# Patient Record
Sex: Female | Born: 1935 | Hispanic: No | Smoking: Never smoker
Health system: Southern US, Community
[De-identification: ages and names within clinical notes are randomized; demographics above are authoritative.]

## PROBLEM LIST (undated history)

## (undated) DIAGNOSIS — M47812 Spondylosis without myelopathy or radiculopathy, cervical region: Secondary | ICD-10-CM

## (undated) DIAGNOSIS — G450 Vertebro-basilar artery syndrome: Secondary | ICD-10-CM

## (undated) DIAGNOSIS — T148XXA Other injury of unspecified body region, initial encounter: Secondary | ICD-10-CM

## (undated) DIAGNOSIS — S92321A Displaced fracture of second metatarsal bone, right foot, initial encounter for closed fracture: Secondary | ICD-10-CM

## (undated) DIAGNOSIS — G5600 Carpal tunnel syndrome, unspecified upper limb: Secondary | ICD-10-CM

## (undated) DIAGNOSIS — I6529 Occlusion and stenosis of unspecified carotid artery: Secondary | ICD-10-CM

## (undated) DIAGNOSIS — K219 Gastro-esophageal reflux disease without esophagitis: Secondary | ICD-10-CM

## (undated) DIAGNOSIS — E785 Hyperlipidemia, unspecified: Secondary | ICD-10-CM

## (undated) DIAGNOSIS — I4891 Unspecified atrial fibrillation: Secondary | ICD-10-CM

## (undated) DIAGNOSIS — G603 Idiopathic progressive neuropathy: Secondary | ICD-10-CM

## (undated) DIAGNOSIS — I639 Cerebral infarction, unspecified: Secondary | ICD-10-CM

## (undated) DIAGNOSIS — M353 Polymyalgia rheumatica: Secondary | ICD-10-CM

## (undated) DIAGNOSIS — E039 Hypothyroidism, unspecified: Secondary | ICD-10-CM

## (undated) DIAGNOSIS — R55 Syncope and collapse: Secondary | ICD-10-CM

## (undated) DIAGNOSIS — M47817 Spondylosis without myelopathy or radiculopathy, lumbosacral region: Secondary | ICD-10-CM

## (undated) HISTORY — DX: Polymyalgia rheumatica: M35.3

## (undated) HISTORY — DX: Occlusion and stenosis of unspecified carotid artery: I65.29

## (undated) HISTORY — PX: CARPAL TUNNEL RELEASE: SHX101

## (undated) HISTORY — DX: Vertebro-basilar artery syndrome: G45.0

## (undated) HISTORY — DX: Unspecified atrial fibrillation: I48.91

## (undated) HISTORY — PX: SUBCLAVIAN STENT PLACEMENT: SUR1038

## (undated) HISTORY — DX: Gastro-esophageal reflux disease without esophagitis: K21.9

## (undated) HISTORY — DX: Hyperlipidemia, unspecified: E78.5

## (undated) HISTORY — DX: Carpal tunnel syndrome, unspecified upper limb: G56.00

## (undated) HISTORY — DX: Other injury of unspecified body region, initial encounter: T14.8XXA

## (undated) HISTORY — DX: Idiopathic progressive neuropathy: G60.3

## (undated) HISTORY — DX: Spondylosis without myelopathy or radiculopathy, lumbosacral region: M47.817

## (undated) HISTORY — DX: Hypothyroidism, unspecified: E03.9

## (undated) HISTORY — DX: Cerebral infarction, unspecified: I63.9

## (undated) HISTORY — DX: Spondylosis without myelopathy or radiculopathy, cervical region: M47.812

## (undated) HISTORY — DX: Displaced fracture of second metatarsal bone, right foot, initial encounter for closed fracture: S92.321A

## (undated) HISTORY — DX: Syncope and collapse: R55

---

## 1998-06-23 ENCOUNTER — Other Ambulatory Visit: Admission: RE | Admit: 1998-06-23 | Discharge: 1998-06-23 | Payer: Self-pay | Admitting: Obstetrics and Gynecology

## 1999-07-22 ENCOUNTER — Other Ambulatory Visit: Admission: RE | Admit: 1999-07-22 | Discharge: 1999-07-22 | Payer: Self-pay | Admitting: Obstetrics and Gynecology

## 2000-08-02 ENCOUNTER — Other Ambulatory Visit: Admission: RE | Admit: 2000-08-02 | Discharge: 2000-08-02 | Payer: Self-pay | Admitting: Obstetrics and Gynecology

## 2001-10-19 ENCOUNTER — Other Ambulatory Visit: Admission: RE | Admit: 2001-10-19 | Discharge: 2001-10-19 | Payer: Self-pay | Admitting: Obstetrics and Gynecology

## 2014-10-07 DIAGNOSIS — B9620 Unspecified Escherichia coli [E. coli] as the cause of diseases classified elsewhere: Secondary | ICD-10-CM | POA: Insufficient documentation

## 2014-10-07 DIAGNOSIS — N390 Urinary tract infection, site not specified: Secondary | ICD-10-CM

## 2014-10-07 DIAGNOSIS — I4819 Other persistent atrial fibrillation: Secondary | ICD-10-CM | POA: Insufficient documentation

## 2015-10-15 DIAGNOSIS — I739 Peripheral vascular disease, unspecified: Secondary | ICD-10-CM | POA: Insufficient documentation

## 2016-03-05 LAB — HEPATIC FUNCTION PANEL
ALK PHOS: 62 U/L (ref 25–125)
ALT: 9 U/L (ref 7–35)
AST: 23 U/L (ref 13–35)
BILIRUBIN, TOTAL: 0.4 mg/dL

## 2016-03-08 LAB — CBC AND DIFFERENTIAL
HCT: 32 % — AB (ref 36–46)
HEMOGLOBIN: 10.3 g/dL — AB (ref 12.0–16.0)
PLATELETS: 236 10*3/uL (ref 150–399)
WBC: 8.3 10*3/mL

## 2016-03-08 LAB — BASIC METABOLIC PANEL
BUN: 20 mg/dL (ref 4–21)
Creatinine: 1.4 mg/dL — AB (ref 0.5–1.1)
Potassium: 3.8 mmol/L (ref 3.4–5.3)
SODIUM: 142 mmol/L (ref 137–147)

## 2016-03-09 ENCOUNTER — Encounter: Payer: Self-pay | Admitting: Internal Medicine

## 2016-03-09 ENCOUNTER — Non-Acute Institutional Stay (SKILLED_NURSING_FACILITY): Payer: Medicare HMO | Admitting: Internal Medicine

## 2016-03-09 DIAGNOSIS — E785 Hyperlipidemia, unspecified: Secondary | ICD-10-CM

## 2016-03-09 DIAGNOSIS — S92401S Displaced unspecified fracture of right great toe, sequela: Secondary | ICD-10-CM

## 2016-03-09 DIAGNOSIS — R5381 Other malaise: Secondary | ICD-10-CM | POA: Diagnosis not present

## 2016-03-09 DIAGNOSIS — D638 Anemia in other chronic diseases classified elsewhere: Secondary | ICD-10-CM

## 2016-03-09 DIAGNOSIS — I480 Paroxysmal atrial fibrillation: Secondary | ICD-10-CM | POA: Diagnosis not present

## 2016-03-09 DIAGNOSIS — N183 Chronic kidney disease, stage 3 unspecified: Secondary | ICD-10-CM

## 2016-03-09 DIAGNOSIS — Z8673 Personal history of transient ischemic attack (TIA), and cerebral infarction without residual deficits: Secondary | ICD-10-CM | POA: Diagnosis not present

## 2016-03-09 DIAGNOSIS — M792 Neuralgia and neuritis, unspecified: Secondary | ICD-10-CM | POA: Diagnosis not present

## 2016-03-09 DIAGNOSIS — E039 Hypothyroidism, unspecified: Secondary | ICD-10-CM | POA: Diagnosis not present

## 2016-03-09 DIAGNOSIS — N390 Urinary tract infection, site not specified: Secondary | ICD-10-CM

## 2016-03-09 DIAGNOSIS — K219 Gastro-esophageal reflux disease without esophagitis: Secondary | ICD-10-CM

## 2016-03-09 DIAGNOSIS — Z9189 Other specified personal risk factors, not elsewhere classified: Secondary | ICD-10-CM | POA: Diagnosis not present

## 2016-03-09 DIAGNOSIS — Z9181 History of falling: Secondary | ICD-10-CM

## 2016-03-09 NOTE — Progress Notes (Signed)
LOCATION: [facility]  PCP: No primary care provider on file.   Code Status: DNR  Goals of care: Advanced Directive information Advanced Directives 03/09/2016  Does patient have an advance directive? Yes  Type of Advance Directive Out of facility DNR (pink MOST or yellow form)  Does patient want to make changes to advanced directive? No - Patient declined  Copy of advanced directive(s) in chart? Yes       No emergency contact information on file.   Allergies  Allergen Reactions  . Bactrim [Sulfamethoxazole-Trimethoprim]   . Macrobid [employer] Macro]   . Penicillins     Chief Complaint  Patient presents with  . New Admit To SNF    New Admission Visit     HPI:  Patient is a 82 y.o. female seen today for short term rehabilitation post hospital admission from 03/04/16-03/08/16 post fall and syncope with right second and fifth toe proximal phalanx fracture. Her syncope workup was unremarkable. She was seen by orthopedic and had a right foot darby shoe placed. She was treated for UTI and has hx of recurrent UTI. She has PMH of frequent falls, recurrent cystitis, afib, CVA, ckd stage 3 among others. She lives at home with her daughter.   Review of Systems:  Constitutional: Negative for fever, chills, diaphoresis.  HENT: Negative for headache, congestion, nasal discharge Eyes: Negative for blurred vision, double vision and discharge.  Respiratory: Negative for cough, shortness of breath and wheezing.   Cardiovascular: Negative for chest pain, palpitations, leg swelling.  Gastrointestinal: Negative for heartburn, nausea, vomiting, abdominal pain. Last bowel movement was this morning. Genitourinary: Negative for dysuria and flank pain.  Musculoskeletal: Negative for back pain, fall in the facility.  Skin: Negative for itching, rash.  Neurological: Negative for dizziness. Psychiatric/Behavioral: Negative for depression   Past Medical History:  Diagnosis  Date  . Atrial fibrillation (HCC)   . Closed fracture of second metatarsal bone of right foot   . GERD (gastroesophageal reflux disease)   . Hypothyroidism   . Stroke (HCC)   . Syncope    History reviewed. No pertinent surgical history. Social History:   has no tobacco, alcohol, and drug history on file.  History reviewed. No pertinent family history.  Medications:   Medication List       Accurate as of 03/09/16 12:49 PM. Always use your most recent med list.          atorvastatin 40 MG tablet Commonly known as:  LIPITOR Take 40 mg by mouth at bedtime.   CALTRATE 600+D3 SOFT PO Take 1 tablet by mouth daily.   DULoxetine 60 MG capsule Commonly known as:  CYMBALTA Take 60 mg by mouth daily.   gabapentin 300 MG capsule Commonly known as:  NEURONTIN Take 300 mg by mouth at bedtime.   levothyroxine 50 MCG tablet Commonly known as:  SYNTHROID, LEVOTHROID Take 50 mcg by mouth daily before breakfast.   metoprolol succinate 25 MG 24 hr tablet Commonly known as:  TOPROL-XL Take 12.5 mg by mouth daily.   multivitamin tablet Take 1 tablet by mouth daily.   omeprazole 40 MG capsule Commonly known as:  PRILOSEC Take 40 mg by mouth daily.   Rivaroxaban 15 MG Tabs tablet Commonly known as:  XARELTO Take 15 mg by mouth daily with supper.   traMADol 50 MG tablet Commonly known as:  ULTRAM Take 50 mg by mouth 2 (two) times daily as needed.   travoprost (benzalkonium) 0.004 % ophthalmic solution Commonly  known as:  TRAVATAN Place 1 drop into both eyes at bedtime.       Immunizations:  There is no immunization history on file for this patient.   Physical Exam:  Vitals:   03/09/16 1233  BP: (!) 155/97  Resp: 18  Temp: 97.6 F (36.4 C)  TempSrc: Oral  SpO2: 95%  Weight: 140 lb (63.5 kg)  Height: 5\' 2"  (1.575 m)   Body mass index is 25.61 kg/m.  General- elderly female, well built, in no acute distress Head- normocephalic, atraumatic Nose- no nasal  discharge Throat- moist mucus membrane Eyes- PERRLA, EOMI, no pallor, no icterus, no discharge, normal conjunctiva, normal sclera Neck- no cervical lymphadenopathy Cardiovascular- normal s1,s2, no murmur, no leg edema Respiratory- bilateral clear to auscultation, no wheeze, no rhonchi, no crackles, no use of accessory muscles Abdomen- bowel sounds present, soft, non tender Musculoskeletal- able to move all 4 extremities, generalized weakness Neurological- alert and oriented to person, place and time, slow with answers, has some cognitive deficit Skin- warm and dry, right leg dressing in place and left leg skin tear noted, chronic skin changes to lower legs Psychiatry- normal mood and affect    Labs reviewed: Basic Metabolic Panel:  Recent Labs  16/10/96  NA 142  K 3.8  BUN 20  CREATININE 1.4*   Liver Function Tests:  Recent Labs  03/05/16  AST 23  ALT 9  ALKPHOS 62   No results for input(s): LIPASE, AMYLASE in the last 8760 hours. No results for input(s): AMMONIA in the last 8760 hours. CBC:  Recent Labs  03/08/16  WBC 8.3  HGB 10.3*  HCT 32*  PLT 236    Radiological Exams: Xr Forearm 03/04/16 Impression. No acute findings. No evidence of fracture or radiopaque foreign body.  Xr Foot 3 or More Views Right 03/04/16 Impression. Nondisplaced fractures involving proximal phalanges of the second through fifth toes.  Echocardiogram W Corlorflow Spectral Doppler 03/05/16 Impression. Ejection fraction is visually estimated at 60-65% Normal left ventricular systolic function. Normal right ventricular size and function. Mild mitral regurgitation. Mild tricuspid regurgitation by color flow doppler examination.  PvL Carotid Duplex Bilateral 03/05/16 Impression. There is heterogenous atherosclerosis seen in both carotid arteries, but only mild internal carotid artery stenosis, 1-39% bilaterally. 2. Vertebral flow is antegrade and normal bilaterally. 3. Subclavian flow is  multiphasic and normal bilaterally.  PvL Venous Duplex Lower Extremity Bilateral 03/05/16 Impression. There is no evidence of DVT or superficial vein thrombophlebitis in the lower extremities bilaterally.   Assessment/Plan  Physical deconditioning Will have her work with physical therapy and occupational therapy team to help with gait training and muscle strengthening exercises.fall precautions. Skin care. Encourage to be out of bed.   Right 2nd and 5th toe proximal phlanax fracture Seen by orthopedic. To wear her derby shoe and WBAT. Will have patient work with PT/OT as tolerated to regain strength and restore function.  Fall precautions are in place.f/u with orthopedic. Continue tramadol 50 mg bid prn pain. Continue calcium and vitamin d supplement  At high risk for injury related to fall High fall risk, fall precautions, on anticoagulation, will need to talk about continuation of anticoagulation if she continues to have fall  Anemia of chronic disease With ckd, monitor cbc  ckd stage 3 Monitor cbc  Recurrent uti Currently asymptomatic and completed her natibiotic. Monitor clinically. Perineal hygiene and hydration to be maintained  Hypothyroidism Continue levothyroxine 50 mcg daily  gerd Continue omeprazole  Elevated BP No known hx of  HTN. Monitor BP q shift x 1 week and if > 3 reading > 150/90, start antihypertensive  HLD Continue atorvastatin  History of CVA Continue atorvastatin and xarelto  afib Rate controlled. Continue toprol xl and xarelto  Neuropathic pain Fall risk, continue gabapentin 300 mg qhs, fall precautions    Goals of care: short term rehabilitation   Labs/tests ordered: cbc, cmp  Family/ staff Communication: reviewed care plan with patient and nursing supervisor    Oneal GroutMAHIMA Adra Shepler, MD Internal Medicine [facility] [address] [place], [place] [address] Cell Phone (Monday-Friday 8 am - 5 pm):  [phone] On Call: [phone] and follow prompts after 5 pm and on weekends Office Phone: [phone] Office Fax: [phone]

## 2016-03-15 LAB — BASIC METABOLIC PANEL
BUN: 16 mg/dL (ref 4–21)
CREATININE: 1.3 mg/dL — AB (ref 0.5–1.1)
Glucose: 113 mg/dL
Potassium: 4.3 mmol/L (ref 3.4–5.3)
Sodium: 143 mmol/L (ref 137–147)

## 2016-03-15 LAB — CBC AND DIFFERENTIAL
HCT: 34 % — AB (ref 36–46)
HEMOGLOBIN: 11 g/dL — AB (ref 12.0–16.0)
Neutrophils Absolute: 4 /uL
Platelets: 256 10*3/uL (ref 150–399)
WBC: 6.4 10*3/mL

## 2016-03-15 LAB — HEPATIC FUNCTION PANEL
ALK PHOS: 79 U/L (ref 25–125)
ALT: 15 U/L (ref 7–35)
AST: 30 U/L (ref 13–35)
Bilirubin, Total: 0.5 mg/dL

## 2016-03-22 ENCOUNTER — Encounter: Payer: Self-pay | Admitting: Adult Health

## 2016-03-22 ENCOUNTER — Non-Acute Institutional Stay (SKILLED_NURSING_FACILITY): Payer: Medicare HMO | Admitting: Adult Health

## 2016-03-22 DIAGNOSIS — R5381 Other malaise: Secondary | ICD-10-CM

## 2016-03-22 DIAGNOSIS — M792 Neuralgia and neuritis, unspecified: Secondary | ICD-10-CM

## 2016-03-22 DIAGNOSIS — I10 Essential (primary) hypertension: Secondary | ICD-10-CM

## 2016-03-22 DIAGNOSIS — E039 Hypothyroidism, unspecified: Secondary | ICD-10-CM

## 2016-03-22 DIAGNOSIS — H409 Unspecified glaucoma: Secondary | ICD-10-CM | POA: Diagnosis not present

## 2016-03-22 DIAGNOSIS — I480 Paroxysmal atrial fibrillation: Secondary | ICD-10-CM | POA: Diagnosis not present

## 2016-03-22 DIAGNOSIS — E785 Hyperlipidemia, unspecified: Secondary | ICD-10-CM

## 2016-03-22 DIAGNOSIS — S92401S Displaced unspecified fracture of right great toe, sequela: Secondary | ICD-10-CM

## 2016-03-22 DIAGNOSIS — F329 Major depressive disorder, single episode, unspecified: Secondary | ICD-10-CM

## 2016-03-22 DIAGNOSIS — Z8673 Personal history of transient ischemic attack (TIA), and cerebral infarction without residual deficits: Secondary | ICD-10-CM | POA: Diagnosis not present

## 2016-03-22 DIAGNOSIS — N183 Chronic kidney disease, stage 3 unspecified: Secondary | ICD-10-CM

## 2016-03-22 DIAGNOSIS — D638 Anemia in other chronic diseases classified elsewhere: Secondary | ICD-10-CM | POA: Diagnosis not present

## 2016-03-22 DIAGNOSIS — F32A Depression, unspecified: Secondary | ICD-10-CM

## 2016-03-22 DIAGNOSIS — K219 Gastro-esophageal reflux disease without esophagitis: Secondary | ICD-10-CM

## 2016-03-22 NOTE — Progress Notes (Signed)
Patient ID: Erin Fisher, female   DOB: Mar 14, 1929, 82 y.o.   MRN: [id number]    DATE:  03/22/2016   MRN:  [id number]  BIRTHDAY: Mar 14, 1929  Facility:  Nursing Home Location:  [facility]  Nursing Home Room Number: 1002-A  LEVEL OF CARE:  SNF (31)  Contact Information    Name Relation Home Work Mobile   White,Sharon Daughter [phone]     No name specified           Code Status History    This patient does not have a recorded code status. Please follow your organizational policy for patients in this situation.    Advance Directive Documentation   Flowsheet Row Most Recent Value  Type of Advance Directive  Out of facility DNR (pink MOST or yellow form)  Pre-existing out of facility DNR order (yellow form or pink MOST form)  No data  "MOST" Form in Place?  No data       Chief Complaint  Patient presents with  . Discharge Note    HISTORY OF PRESENT ILLNESS:  This is an 82 year old female who is for discharge home with Home health PT, OT and CNA.  [facility] 03/04/16 - 03/08/16. She was hospitalized post fall and syncope with right second and fifth toe proximal phalanx fracture. Her wok-up in the hospital was unremarkable. Orthopedic was consulted and was placed on right foot darby shoe. She was treated for UTI.  She was seen today in her room while combing herself in front of the mirror. She was happy to be going home, smiling.  Patient was admitted to this facility for short-term rehabilitation after the patient's recent hospitalization.  Patient has completed SNF rehabilitation and therapy has cleared the patient for discharge.     PAST MEDICAL HISTORY:  Past Medical History:  Diagnosis Date  . Atrial fibrillation (HCC)   . Closed fracture of second metatarsal bone of right foot   . GERD (gastroesophageal reflux disease)   . Hypothyroidism   . Stroke  (HCC)   . Syncope      CURRENT MEDICATIONS: Reviewed  Patient's Medications  New Prescriptions   No medications on file  Previous Medications   ACETAMINOPHEN (TYLENOL) 325 MG TABLET    Take 650 mg by mouth every 4 (four) hours as needed for mild pain or moderate pain.   ATORVASTATIN (LIPITOR) 40 MG TABLET    Take 40 mg by mouth at bedtime.   CALCIUM CARB-CHOLECALCIFEROL (CALTRATE 600+D3 SOFT PO)    Take 1 tablet by mouth daily.   DULOXETINE (CYMBALTA) 60 MG CAPSULE    Take 60 mg by mouth daily.    GABAPENTIN (NEURONTIN) 300 MG CAPSULE    Take 300 mg by mouth at bedtime.    LATANOPROST (XALATAN) 0.005 % OPHTHALMIC SOLUTION    Place 1 drop into both eyes at bedtime.   LEVOTHYROXINE (SYNTHROID, LEVOTHROID) 50 MCG TABLET    Take 50 mcg by mouth daily before breakfast.    METOPROLOL SUCCINATE (TOPROL-XL) 25 MG 24 HR TABLET    Take 12.5 mg by mouth daily.    MULTIPLE VITAMIN (MULTIVITAMIN) TABLET    Take 1 tablet by mouth daily.   OMEPRAZOLE (PRILOSEC) 40 MG CAPSULE    Take 40 mg by mouth daily.    RIVAROXABAN (XARELTO) 15 MG TABS TABLET    Take 15 mg by mouth daily with supper.   Modified  Medications   No medications on file  Discontinued Medications   TRAMADOL (ULTRAM) 50 MG TABLET    Take 50 mg by mouth 2 (two) times daily as needed.   TRAVOPROST, BENZALKONIUM, (TRAVATAN) 0.004 % OPHTHALMIC SOLUTION    Place 1 drop into both eyes at bedtime.     Allergies  Allergen Reactions  . Bactrim [Sulfamethoxazole-Trimethoprim]   . Macrobid [employer][Nitrofurantoin [employer] Macro]   . Penicillins      REVIEW OF SYSTEMS:  GENERAL: no change in appetite, no fatigue, no weight changes, no fever, chills or weakness EYES: Denies change in vision, dry eyes, eye pain, itching or discharge EARS: Denies change in hearing, ringing in ears, or earache NOSE: Denies nasal congestion or epistaxis MOUTH and THROAT: Denies oral discomfort, gingival pain or bleeding, pain from teeth or hoarseness   RESPIRATORY:  no cough, SOB, DOE, wheezing, hemoptysis CARDIAC: no chest pain, edema or palpitations GI: no abdominal pain, diarrhea, constipation, heart burn, nausea or vomiting GU: Denies dysuria, frequency, hematuria, incontinence, or discharge PSYCHIATRIC: Denies feeling of depression or anxiety. No report of hallucinations, insomnia, paranoia, or agitation    PHYSICAL EXAMINATION  GENERAL APPEARANCE: Well nourished. In no acute distress. Normal body habitus SKIN:  Right posterior forearm with foam dressing and right shin with silicone dressing HEAD: Normal in size and contour. No evidence of trauma EYES: Lids open and close normally. No blepharitis, entropion or ectropion. PERRL. Conjunctivae are clear and sclerae are white. Lenses are without opacity EARS: Pinnae are normal. Patient hears normal voice tunes of the examiner MOUTH and THROAT: Lips are without lesions. Oral mucosa is moist and without lesions. Tongue is normal in shape, size, and color and without lesions NECK: supple, trachea midline, no neck masses, no thyroid tenderness, no thyromegaly LYMPHATICS: no LAN in the neck, no supraclavicular LAN RESPIRATORY: breathing is even & unlabored, BS CTAB CARDIAC: RRR, no murmur,no extra heart sounds, Right foot 1+ edema GI: abdomen soft, normal BS, no masses, no tenderness, no hepatomegaly, no splenomegaly EXTREMITIES:  Able to move X 4 extremities; Right foot with darby boot PSYCHIATRIC: Alert and oriented X 3. Affect and behavior are appropriate  LABS/RADIOLOGY: Labs reviewed: Basic Metabolic Panel:  Recent Labs  30/86/5710/16/17 03/15/16  NA 142 143  K 3.8 4.3  BUN 20 16  CREATININE 1.4* 1.3*   Liver Function Tests:  Recent Labs  03/05/16 03/15/16  AST 23 30  ALT 9 15  ALKPHOS 62 79   CBC:  Recent Labs  03/08/16 03/15/16  WBC 8.3 6.4  NEUTROABS  --  4  HGB 10.3* 11.0*  HCT 32* 34*  PLT 236 256      ASSESSMENT/PLAN:  Physical deconditioning - for Home health PT, OT and  CNA, for therapeutic strengthening exercises; fall precaution  Right 2nd and 5th toe proximal phalanx fracture -  Orthopedic was consulted in the hospital and was placed on draby shoe; WBAT; for Home health PT, OT and CNA, for therapeutic strengthening exercises; continue Tylenol 325 2 tabs= 650 mg PO Q 4 hours PRN; follow-up with orthopedics   Hypothyroidism - continue Synthroid 50 mcg 1 tab PO Q D  GERD - continue Omeprazole 40 mg 1 capsule PO Q D  Depression - continue Cymbalta 60 mg 1 capsule PO Q D  Hypertension - continue Toprol XL 25 mg 1/2 tab = 12.5 mg PO Q D  Hyperlipidemia - continue Lipitor 40 mg 1 tab PO Q HS  Neuropathic pain - continue Gabapentin 300 mg  1 capsule PO Q HS  Hx of CVA - stable; continue Xarelto 15 mg 1 tab PO Q 5PM  Glaucoma - no complaints of eye pain nor vision changes; continue Latanoprost 0.005% 1 gtt into each eye Q evening  Anemia of chronic disease  - stable Lab Results  Component Value Date   HGB 11.0 (A) 03/15/2016   CKD, stage 3 - stable Lab Results  Component Value Date   CREATININE 1.3 (A) 03/15/2016   PAF - rate-controlled; continue Toprol XL 25 mg 1/2 tab po Q D      I have filled out patient's discharge paperwork and written prescriptions.  Patient will receive home health PT, OT and CNA.  DME provided:  None  Total discharge time: Greater than 30 minutes Greater than 50% was spent in counseling and coordination of care with the patient.   Discharge time involved coordination of the discharge process with social worker, nursing staff and therapy department. Medical justification for home health services verified.     Kenard Gower, NP [employer] [phone]

## 2016-03-24 DIAGNOSIS — S92351D Displaced fracture of fifth metatarsal bone, right foot, subsequent encounter for fracture with routine healing: Secondary | ICD-10-CM | POA: Diagnosis not present

## 2016-03-24 DIAGNOSIS — S93321D Subluxation of tarsometatarsal joint of right foot, subsequent encounter: Secondary | ICD-10-CM | POA: Diagnosis not present

## 2016-03-24 DIAGNOSIS — S92341D Displaced fracture of fourth metatarsal bone, right foot, subsequent encounter for fracture with routine healing: Secondary | ICD-10-CM | POA: Diagnosis not present

## 2016-03-24 DIAGNOSIS — N183 Chronic kidney disease, stage 3 (moderate): Secondary | ICD-10-CM | POA: Diagnosis not present

## 2016-03-25 ENCOUNTER — Other Ambulatory Visit: Payer: Self-pay | Admitting: Adult Health

## 2016-06-03 DIAGNOSIS — N390 Urinary tract infection, site not specified: Secondary | ICD-10-CM | POA: Insufficient documentation

## 2016-09-28 DIAGNOSIS — S72142A Displaced intertrochanteric fracture of left femur, initial encounter for closed fracture: Secondary | ICD-10-CM | POA: Insufficient documentation

## 2016-09-28 LAB — HEPATIC FUNCTION PANEL
ALK PHOS: 0.5 U/L — AB (ref 25–125)
ALT: 16 U/L (ref 7–35)
AST: 35 U/L (ref 13–35)
Bilirubin, Total: 0.5 mg/dL

## 2016-09-29 LAB — PROTIME-INR: PROTIME: 15 s — AB (ref 10.0–13.8)

## 2016-09-29 LAB — POCT INR: INR: 1.2 — AB (ref 0.9–1.1)

## 2016-10-03 LAB — BASIC METABOLIC PANEL
BUN: 11 mg/dL (ref 4–21)
CREATININE: 1 mg/dL (ref 0.5–1.1)
Potassium: 3.6 mmol/L (ref 3.4–5.3)
Sodium: 133 mmol/L — AB (ref 137–147)

## 2016-10-06 LAB — CBC AND DIFFERENTIAL
HEMATOCRIT: 33 % — AB (ref 36–46)
Hemoglobin: 11.3 g/dL — AB (ref 12.0–16.0)
Platelets: 245 10*3/uL (ref 150–399)
WBC: 10.3 10^3/mL

## 2016-10-07 ENCOUNTER — Encounter: Payer: Self-pay | Admitting: Internal Medicine

## 2016-10-07 ENCOUNTER — Non-Acute Institutional Stay (SKILLED_NURSING_FACILITY): Payer: Medicare HMO | Admitting: Internal Medicine

## 2016-10-07 DIAGNOSIS — G450 Vertebro-basilar artery syndrome: Secondary | ICD-10-CM | POA: Insufficient documentation

## 2016-10-07 DIAGNOSIS — N390 Urinary tract infection, site not specified: Secondary | ICD-10-CM

## 2016-10-07 DIAGNOSIS — F329 Major depressive disorder, single episode, unspecified: Secondary | ICD-10-CM

## 2016-10-07 DIAGNOSIS — D62 Acute posthemorrhagic anemia: Secondary | ICD-10-CM | POA: Diagnosis not present

## 2016-10-07 DIAGNOSIS — I4819 Other persistent atrial fibrillation: Secondary | ICD-10-CM

## 2016-10-07 DIAGNOSIS — K219 Gastro-esophageal reflux disease without esophagitis: Secondary | ICD-10-CM

## 2016-10-07 DIAGNOSIS — S7012XD Contusion of left thigh, subsequent encounter: Secondary | ICD-10-CM | POA: Diagnosis not present

## 2016-10-07 DIAGNOSIS — G603 Idiopathic progressive neuropathy: Secondary | ICD-10-CM | POA: Insufficient documentation

## 2016-10-07 DIAGNOSIS — G5600 Carpal tunnel syndrome, unspecified upper limb: Secondary | ICD-10-CM | POA: Insufficient documentation

## 2016-10-07 DIAGNOSIS — I639 Cerebral infarction, unspecified: Secondary | ICD-10-CM | POA: Insufficient documentation

## 2016-10-07 DIAGNOSIS — M47812 Spondylosis without myelopathy or radiculopathy, cervical region: Secondary | ICD-10-CM | POA: Insufficient documentation

## 2016-10-07 DIAGNOSIS — M47817 Spondylosis without myelopathy or radiculopathy, lumbosacral region: Secondary | ICD-10-CM | POA: Insufficient documentation

## 2016-10-07 DIAGNOSIS — Z96642 Presence of left artificial hip joint: Secondary | ICD-10-CM | POA: Diagnosis not present

## 2016-10-07 DIAGNOSIS — I481 Persistent atrial fibrillation: Secondary | ICD-10-CM

## 2016-10-07 DIAGNOSIS — B9620 Unspecified Escherichia coli [E. coli] as the cause of diseases classified elsewhere: Secondary | ICD-10-CM

## 2016-10-07 DIAGNOSIS — M353 Polymyalgia rheumatica: Secondary | ICD-10-CM | POA: Insufficient documentation

## 2016-10-07 DIAGNOSIS — F32A Depression, unspecified: Secondary | ICD-10-CM

## 2016-10-07 DIAGNOSIS — S72145D Nondisplaced intertrochanteric fracture of left femur, subsequent encounter for closed fracture with routine healing: Secondary | ICD-10-CM | POA: Diagnosis not present

## 2016-10-07 DIAGNOSIS — E785 Hyperlipidemia, unspecified: Secondary | ICD-10-CM | POA: Insufficient documentation

## 2016-10-07 NOTE — Progress Notes (Signed)
: Provider:  Randon Goldsmith. Lyn Hollingshead, MD Location:  [facility] Room Number: [phone] Place of Service:  SNF ([phone])  PCP: No primary care provider on file. No care team member to display  Extended Emergency Contact Information Primary Emergency Contact: White,Sharon Address: [address]          [place], [place] [address] D[place] of [place] Home Phone: [phone] Relation: Daughter     Allergies: Bactrim [sulfamethoxazole-trimethoprim]; Macrobid [nitrofurantoin monohyd macro]; and Penicillins  Chief Complaint  Patient presents with  . New Admit To SNF    Admit to Facility    HPI: Patient is 83 y.o. female withA. fib, recurrent UTI, hypothyroidism, GERD, who came to the ER after a fall. Patient fell in the bathroom landing on her left side about trying to move from her wheelchair to the toilet. In the ED she was found to have a left hip fracture. Patient was admitted to [facility] from May 8-16 where she underwent a left hip intramedullary nailing for a left femur intertrochanteric fracture on 09/29/2016. Hospital course was complicated by a UTI and a left thigh hematoma. An acute blood loss from hematoma and from the postoperative period required a transfusion. Patient is admitted to skilled nursing facility with generalized weakness for OT/PT. While at skilled nursing facility patient will be followed for  depression treated with Cymbalta, and hypothyroidism treated with Synthroid annd atrial fibrillation treated with Xarelto and metoprolol.  Past Medical History:  Diagnosis Date  . Acid reflux   . Atrial fibrillation (HCC)   . Carpal tunnel syndrome   . Cervical spondylosis without myelopathy   . Closed fracture of second metatarsal bone of right foot   . GERD (gastroesophageal reflux disease)   . Hyperlipidemia   . Hypothyroidism   . Idiopathic progressive polyneuropathy   . Lumbosacral spondylosis without myelopathy   . Nerve damage     both legs and feet  . Occlusion and stenosis of unspecified carotid artery    without mention of cerebral infarction  . Polymyalgia rheumatica (HCC)   . Stroke (HCC)   . Syncope   . Vertebral artery syndrome     Past Surgical History:  Procedure Laterality Date  . CARPAL TUNNEL RELEASE    . SUBCLAVIAN STENT PLACEMENT      Allergies as of 10/07/2016      Reactions   Bactrim [sulfamethoxazole-trimethoprim]    Macrobid [nitrofurantoin Monohyd Macro]    Penicillins       Medication List       Accurate as of 10/07/16  9:44 AM. Always use your most recent med list.          atorvastatin 40 MG tablet Commonly known as:  LIPITOR Take 40 mg by mouth at bedtime.   bisacodyl 5 MG EC tablet Commonly known as:  DULCOLAX Take 10 mg by mouth daily as needed.   CALTRATE 600+D3 SOFT PO Take 1 tablet by mouth daily.   ciprofloxacin 250 MG tablet Commonly known as:  CIPRO Take 250 mg by mouth 2 (two) times daily.   DULoxetine 60 MG capsule Commonly known as:  CYMBALTA Take 60 mg by mouth daily.   gabapentin 300 MG capsule Commonly known as:  NEURONTIN Take 300 mg by mouth at bedtime.   levothyroxine 50 MCG tablet Commonly known as:  SYNTHROID, LEVOTHROID Take 50 mcg by mouth daily before breakfast.   metoprolol succinate 25 MG 24 hr tablet Commonly known as:  TOPROL-XL  Take 12.5 mg by mouth daily.   multivitamin tablet Take 1 tablet by mouth every morning.   omeprazole 40 MG capsule Commonly known as:  PRILOSEC Take 40 mg by mouth daily.   Rivaroxaban 15 MG Tabs tablet Commonly known as:  XARELTO Take 15 mg by mouth daily with supper.   traMADol 50 MG tablet Commonly known as:  ULTRAM Take 50 mg by mouth at bedtime as needed.       Meds ordered this encounter  Medications  . bisacodyl (DULCOLAX) 5 MG EC tablet    Sig: Take 10 mg by mouth daily as needed.  . ciprofloxacin (CIPRO) 250 MG tablet    Sig: Take 250 mg by mouth 2 (two) times daily.  .  traMADol (ULTRAM) 50 MG tablet    Sig: Take 50 mg by mouth at bedtime as needed.    Immunization History  Administered Date(s) Administered  . Influenza, High Dose Seasonal PF 03/17/2015  . Influenza,inj,Quad PF,36+ Mos 03/24/2014, 03/08/2016  . Pneumococcal Conjugate-13 07/10/2014  . Pneumococcal Polysaccharide-23 07/11/2014  . Tdap 03/04/2016    Social History  Substance Use Topics  . Smoking status: Never Smoker  . Smokeless tobacco: Never Used  . Alcohol use No    Family history is   Family History  Problem Relation Age of Onset  . Heart disease Mother   . Arthritis Mother   . Heart disease Father   . Cancer Daughter       Review of Systems  DATA OBTAINED: from patient, daughter GENERAL:  no fevers, fatigue, appetite changes SKIN: No itching, or rash EYES: No eye pain, redness, discharge EARS: No earache, tinnitus, change in hearing NOSE: No congestion, drainage or bleeding  MOUTH/THROAT: No mouth or tooth pain, No sore throat RESPIRATORY: No cough, wheezing, SOB CARDIAC: No chest pain, palpitations, lower extremity edema  GI: No abdominal pain, No N/V/D or constipation, No heartburn or reflux  GU: No dysuria, frequency or urgency, or incontinence  MUSCULOSKELETAL: No unrelieved bone/joint pain NEUROLOGIC: No headache, dizziness or focal weakness PSYCHIATRIC: No c/o anxiety or sadness   Vitals:   10/07/16 0917  BP: 139/81  Pulse: 71  Resp: 20  Temp: 98.1 F (36.7 C)    SpO2 Readings from Last 1 Encounters:  10/07/16 95%   Body mass index is 28.43 kg/m.     Physical Exam  GENERAL APPEARANCE: Alert, conversant,  No acute distress.  SKIN: No diaphoresis rash HEAD: Normocephalic, atraumatic  EYES: Conjunctiva/lids clear. Pupils round, reactive. EOMs intact.  EARS: External exam WNL, canals clear. Hearing grossly normal.  NOSE: No deformity or discharge.  MOUTH/THROAT: Lips w/o lesions  RESPIRATORY: Breathing is even, unlabored. Lung sounds are  clear   CARDIOVASCULAR: Heart RRR no murmurs, rubs or gallops; LLE edema >> than expected from surgery alone   GASTROINTESTINAL: Abdomen is soft, non-tender, not distended w/ normal bowel sounds. GENITOURINARY: Bladder non tender, not distended  MUSCULOSKELETAL: No abnormal joints or musculature NEUROLOGIC:  Cranial nerves 2-12 grossly intact. Moves all extremities  PSYCHIATRIC: Mood and affect appropriate to situation, no behavioral issues  Patient Active Problem List   Diagnosis Date Noted  . Acid reflux   . Carpal tunnel syndrome   . Cervical spondylosis without myelopathy   . Hyperlipidemia   . Idiopathic progressive polyneuropathy   . Lumbosacral spondylosis without myelopathy   . Polymyalgia rheumatica (HCC)   . Stroke (HCC)   . Vertebral artery syndrome   . PAF (paroxysmal atrial fibrillation) (HCC) 03/22/2016  .  History of CVA (cerebrovascular accident) 03/22/2016      Labs reviewed: Basic Metabolic Panel:    Component Value Date/Time   NA 133 (A) 10/03/2016   K 3.6 10/03/2016   BUN 11 10/03/2016   CREATININE 1.0 10/03/2016   AST 35 09/28/2016   ALT 16 09/28/2016   ALKPHOS 0.5 (A) 09/28/2016     Recent Labs  03/08/16 03/15/16 10/03/16  NA 142 143 133*  K 3.8 4.3 3.6  BUN 20 16 11   CREATININE 1.4* 1.3* 1.0   Liver Function Tests:  Recent Labs  03/05/16 03/15/16 09/28/16  AST 23 30 35  ALT 9 15 16   ALKPHOS 62 79 0.5*   No results for input(s): LIPASE, AMYLASE in the last 8760 hours. No results for input(s): AMMONIA in the last 8760 hours. CBC:  Recent Labs  03/08/16 03/15/16 10/06/16  WBC 8.3 6.4 10.3  NEUTROABS  --  4  --   HGB 10.3* 11.0* 11.3*  HCT 32* 34* 33*  PLT 236 256 245   Lipid No results for input(s): CHOL, HDL, LDLCALC, TRIG in the last 8760 hours.  Cardiac Enzymes: No results for input(s): CKTOTAL, CKMB, CKMBINDEX, TROPONINI in the last 8760 hours. BNP: No results for input(s): BNP in the last 8760 hours. No results found  for: MICROALBUR No results found for: HGBA1C No results found for: TSH No results found for: VITAMINB12 No results found for: FOLATE No results found for: IRON, TIBC, FERRITIN  Imaging and Procedures obtained prior to SNF admission: No results found.   Not all labs, radiology exams or other studies done during hospitalization come through on my EPIC note; however they are reviewed by me.    Assessment and Plan  L INTERTROCHANTERIC FEMUR FRACTURE/ S/P INTRAMEDULLARY HIP NAILING -  On 5/9, prophylaxis with xarelto SNF - admitted for OT/PT, restart xarelto as prophylaxis  L  HEMATOMA/ ABLA - 2/2 fracture and post-op and xarelto; scanned; xarelto will be held 4 days then restarted at SNF; pt tx 3 u PRBC; Hb stable at d/c at 11; baseline felt to be 10 SNF - will f/u CBC  E COLI UTI - treated wit Cipro in hosp per culture SNF - cont with Cipro 250 mg BID for 2 more days   AF SNF - stable ;cont with metoprolol XL 25 mg daily and xarelto 15 mg daily as prophylaxis  DEPRESSION SNF - stable;cont cymbalta 60 mg daily  HYPOTHYROIDISM SNF - stable; cont synthroid 50 mcg daily  GERD SNF - stable cont omeprazole 40 mg daily  Time spent > 45 min;> 50% of time with patient was spent reviewing records, labs, tests and studies, counseling and developing plan of care  Thurston Hole D. Lyn Hollingshead, MD

## 2016-10-08 ENCOUNTER — Encounter: Payer: Self-pay | Admitting: Internal Medicine

## 2016-10-08 DIAGNOSIS — S7012XD Contusion of left thigh, subsequent encounter: Secondary | ICD-10-CM | POA: Insufficient documentation

## 2016-10-08 DIAGNOSIS — D62 Acute posthemorrhagic anemia: Secondary | ICD-10-CM | POA: Insufficient documentation

## 2016-10-08 DIAGNOSIS — F329 Major depressive disorder, single episode, unspecified: Secondary | ICD-10-CM | POA: Insufficient documentation

## 2016-10-08 DIAGNOSIS — Z96649 Presence of unspecified artificial hip joint: Secondary | ICD-10-CM | POA: Insufficient documentation

## 2016-10-08 DIAGNOSIS — F32A Depression, unspecified: Secondary | ICD-10-CM | POA: Insufficient documentation

## 2016-10-11 LAB — CBC AND DIFFERENTIAL
HCT: 37 % (ref 36–46)
Hemoglobin: 11.9 g/dL — AB (ref 12.0–16.0)
Platelets: 355 K/µL (ref 150–399)
WBC: 11.3 10*3/mL

## 2016-10-11 LAB — BASIC METABOLIC PANEL WITH GFR
BUN: 15 mg/dL (ref 4–21)
Creatinine: 1.2 mg/dL — AB (ref 0.5–1.1)
Glucose: 87 mg/dL
Potassium: 3.6 mmol/L (ref 3.4–5.3)
Sodium: 139 mmol/L (ref 137–147)

## 2016-10-19 ENCOUNTER — Non-Acute Institutional Stay (SKILLED_NURSING_FACILITY): Payer: Medicare HMO | Admitting: Internal Medicine

## 2016-10-19 DIAGNOSIS — I739 Peripheral vascular disease, unspecified: Secondary | ICD-10-CM

## 2016-10-19 DIAGNOSIS — M7989 Other specified soft tissue disorders: Secondary | ICD-10-CM | POA: Diagnosis not present

## 2016-10-20 ENCOUNTER — Encounter: Payer: Self-pay | Admitting: Internal Medicine

## 2016-10-20 NOTE — Progress Notes (Signed)
Location:  [facility] Room Number: [phone] Place of Service:  SNF [phone])  Erin Fisher. Erin Hollingshead, MD  No care team member to display  Extended Emergency Contact Information Primary Emergency Contact: White,Sharon Address: [address]          [place], [place] [address] [place] of [place] Home Phone: [phone] Relation: Daughter    Allergies: Bactrim [sulfamethoxazole-trimethoprim]; Macrobid [nitrofurantoin monohyd macro]; and Penicillins  Chief Complaint  Patient presents with  . Acute Visit    Acute    HPI: Patient is 83 y.o. female who Whom nursing asked me to see because she has blue discoloration of both legs which is chronic but appears to then to be worse today. Both the left and the right foot have some discoloration in her swollen the left leg is much more swollen than the right leg. Patient denies any, pain with palpation. Nothing makes it better, nothing makes it worse. Patient has had no fever or other systemic symptoms. Patient with history of PVD and on Xarelto.  Past Medical History:  Diagnosis Date  . Acid reflux   . Atrial fibrillation (HCC)   . Carpal tunnel syndrome   . Cervical spondylosis without myelopathy   . Closed fracture of second metatarsal bone of right foot   . GERD (gastroesophageal reflux disease)   . Hyperlipidemia   . Hypothyroidism   . Idiopathic progressive polyneuropathy   . Lumbosacral spondylosis without myelopathy   . Nerve damage    both legs and feet  . Occlusion and stenosis of unspecified carotid artery    without mention of cerebral infarction  . Polymyalgia rheumatica (HCC)   . Stroke (HCC)   . Syncope   . Vertebral artery syndrome     Past Surgical History:  Procedure Laterality Date  . CARPAL TUNNEL RELEASE    . SUBCLAVIAN STENT PLACEMENT      Allergies as of 10/19/2016      Reactions   Bactrim [sulfamethoxazole-trimethoprim]    Macrobid [nitrofurantoin Monohyd Macro]    Penicillins         Medication List       Accurate as of 10/19/16 11:59 PM. Always use your most recent med list.          atorvastatin 40 MG tablet Commonly known as:  LIPITOR Take 40 mg by mouth at bedtime.   bisacodyl 5 MG EC tablet Commonly known as:  DULCOLAX Take 10 mg by mouth daily as needed.   CALTRATE 600+D3 SOFT PO Take 1 tablet by mouth daily.   DULoxetine 60 MG capsule Commonly known as:  CYMBALTA Take 60 mg by mouth daily.   gabapentin 300 MG capsule Commonly known as:  NEURONTIN Take 300 mg by mouth at bedtime.   levothyroxine 50 MCG tablet Commonly known as:  SYNTHROID, LEVOTHROID Take 50 mcg by mouth daily before breakfast.   metoprolol succinate 25 MG 24 hr tablet Commonly known as:  TOPROL-XL Take 12.5 mg by mouth daily.   multivitamin tablet Take 1 tablet by mouth every morning.   omeprazole 40 MG capsule Commonly known as:  PRILOSEC Take 40 mg by mouth daily.   Rivaroxaban 15 MG Tabs tablet Commonly known as:  XARELTO Take 15 mg by mouth daily with supper.   traMADol 50 MG tablet Commonly known as:  ULTRAM Take 50 mg by mouth at bedtime as needed.       No orders of the defined types were placed in this encounter.  Immunization History  Administered Date(s) Administered  . Influenza, High Dose Seasonal PF 03/17/2015  . Influenza,inj,Quad PF,36+ Mos 03/24/2014, 03/08/2016  . Pneumococcal Conjugate-13 07/10/2014  . Pneumococcal Polysaccharide-23 07/11/2014  . Tdap 03/04/2016    Social History  Substance Use Topics  . Smoking status: Never Smoker  . Smokeless tobacco: Never Used  . Alcohol use No    Review of Systems  DATA OBTAINED: from patient, nurse GENERAL:  no fevers, fatigue, appetite changes SKIN: Discoloration and swelling to legs as per history of present illness; patient denies pain HEENT: No complaint RESPIRATORY: No cough, wheezing, SOB CARDIAC: No chest pain, palpitations, lower extremity edema  GI: No abdominal pain,  No N/V/D or constipation, No heartburn or reflux  GU: No dysuria, frequency or urgency, or incontinence  MUSCULOSKELETAL: No unrelieved bone/joint pain NEUROLOGIC: No headache, dizziness  PSYCHIATRIC: No overt anxiety or sadness  Vitals:   10/19/16 1349  BP: (!) 161/71  Pulse: 83  Resp: 20  Temp: 97.1 F (36.2 C)   Body mass index is 25.9 kg/m. Physical Exam  GENERAL APPEARANCE: Alert, conversant, No acute distress  SKIN: Both patient's left and right foot are discolored with some swelling mild coolness, dorsalis pedis pulse palpated bilaterally; swelling to the left leg is much much greater than to the right leg HEENT: Unremarkable RESPIRATORY: Breathing is even, unlabored. Lung sounds are clear   CARDIOVASCULAR: Heart RRR no murmurs, rubs or gallops. Bilateral pedal peripheral edema  GASTROINTESTINAL: Abdomen is soft, non-tender, not distended w/ normal bowel sounds.  GENITOURINARY: Bladder non tender, not distended  MUSCULOSKELETAL: No abnormal joints or musculature NEUROLOGIC: Cranial nerves 2-12 grossly intact. Moves all extremities PSYCHIATRIC: Mood and affect appropriate to situation, no behavioral issues  Patient Active Problem List   Diagnosis Date Noted  . S/P total hip arthroplasty 10/08/2016  . Thigh hematoma, left, subsequent encounter 10/08/2016  . Acute blood loss as cause of postoperative anemia 10/08/2016  . Depression 10/08/2016  . GERD (gastroesophageal reflux disease)   . Carpal tunnel syndrome   . Cervical spondylosis without myelopathy   . Hyperlipidemia   . Idiopathic progressive polyneuropathy   . Lumbosacral spondylosis without myelopathy   . Polymyalgia rheumatica (HCC)   . Stroke (HCC)   . Vertebral artery syndrome   . Closed intertrochanteric fracture of left femur (HCC) 09/28/2016  . Recurrent UTI 06/03/2016  . PAF (paroxysmal atrial fibrillation) (HCC) 03/22/2016  . History of CVA (cerebrovascular accident) 03/22/2016  . PVD (peripheral  vascular disease) (HCC) 10/15/2015  . E. coli UTI (urinary tract infection) 10/07/2014  . Persistent atrial fibrillation (HCC) 10/07/2014    CMP     Component Value Date/Time   NA 139 10/11/2016   K 3.6 10/11/2016   BUN 15 10/11/2016   CREATININE 1.2 (A) 10/11/2016   AST 35 09/28/2016   ALT 16 09/28/2016   ALKPHOS 0.5 (A) 09/28/2016    Recent Labs  03/15/16 10/03/16 10/11/16  NA 143 133* 139  K 4.3 3.6 3.6  BUN 16 11 15   CREATININE 1.3* 1.0 1.2*    Recent Labs  03/05/16 03/15/16 09/28/16  AST 23 30 35  ALT 9 15 16   ALKPHOS 62 79 0.5*    Recent Labs  03/15/16 10/06/16 10/11/16  WBC 6.4 10.3 11.3  NEUTROABS 4  --   --   HGB 11.0* 11.3* 11.9*  HCT 34* 33* 37  PLT 256 245 355   No results for input(s): CHOL, LDLCALC, TRIG in the last 8760 hours.  Invalid  input(s): HCL No results found for: MICROALBUR No results found for: TSH No results found for: HGBA1C No results found for: CHOL, HDL, LDLCALC, LDLDIRECT, TRIG, CHOLHDL  Significant Diagnostic Results in last 30 days:  No results found.  Assessment and Plan  SWELLING LOWER EXTREMITIES/PVD-because leg seems more swollen than usual have ordered a bilateral lower extremity ultrasound; this is probably patient's peripheral vascular disease, and she is already on xarelto; there were no signs of skin breakdown; will continue to monitor  Later entry-patient's bilateral lower extremity ultrasound were negative   Thurston HoleAnne D. Erin HollingsheadAlexander, MD

## 2016-10-25 ENCOUNTER — Non-Acute Institutional Stay (SKILLED_NURSING_FACILITY): Payer: Medicare HMO | Admitting: Internal Medicine

## 2016-10-25 ENCOUNTER — Encounter: Payer: Self-pay | Admitting: Internal Medicine

## 2016-10-25 DIAGNOSIS — B9620 Unspecified Escherichia coli [E. coli] as the cause of diseases classified elsewhere: Secondary | ICD-10-CM

## 2016-10-25 DIAGNOSIS — N390 Urinary tract infection, site not specified: Secondary | ICD-10-CM | POA: Diagnosis not present

## 2016-10-25 DIAGNOSIS — E034 Atrophy of thyroid (acquired): Secondary | ICD-10-CM | POA: Diagnosis not present

## 2016-10-25 DIAGNOSIS — Z96642 Presence of left artificial hip joint: Secondary | ICD-10-CM

## 2016-10-25 DIAGNOSIS — S72145D Nondisplaced intertrochanteric fracture of left femur, subsequent encounter for closed fracture with routine healing: Secondary | ICD-10-CM

## 2016-10-25 DIAGNOSIS — F329 Major depressive disorder, single episode, unspecified: Secondary | ICD-10-CM | POA: Diagnosis not present

## 2016-10-25 DIAGNOSIS — K219 Gastro-esophageal reflux disease without esophagitis: Secondary | ICD-10-CM | POA: Diagnosis not present

## 2016-10-25 DIAGNOSIS — E039 Hypothyroidism, unspecified: Secondary | ICD-10-CM | POA: Insufficient documentation

## 2016-10-25 DIAGNOSIS — S7012XD Contusion of left thigh, subsequent encounter: Secondary | ICD-10-CM | POA: Diagnosis not present

## 2016-10-25 DIAGNOSIS — D62 Acute posthemorrhagic anemia: Secondary | ICD-10-CM

## 2016-10-25 DIAGNOSIS — I481 Persistent atrial fibrillation: Secondary | ICD-10-CM | POA: Diagnosis not present

## 2016-10-25 DIAGNOSIS — I4819 Other persistent atrial fibrillation: Secondary | ICD-10-CM

## 2016-10-25 DIAGNOSIS — F32A Depression, unspecified: Secondary | ICD-10-CM

## 2016-10-25 NOTE — Progress Notes (Signed)
Location:  [facility] Room Number: [phone] Place of Service:  SNF [phone]) Randon Goldsmith. Lyn Hollingshead, MD  PCP: No primary care provider on file. No care team member to display  Extended Emergency Contact Information Primary Emergency Contact: White,Sharon Address: [address]          [place], [place] [address] [place] of [place] Home Phone: [phone] Relation: Daughter  Allergies  Allergen Reactions  . Bactrim [Sulfamethoxazole-Trimethoprim]   . Macrobid [employer] Macro]   . Penicillins     Chief Complaint  Patient presents with  . Discharge Note    Discharged from SNF    HPI:  83 y.o. female withA. fib, recurrent UTI, hypothyroidism, GERD, who came to the ER after a fall. Patient fell in the bathroom landing on her left side about trying to move from her wheelchair to the toilet. In the ED she was found to have a left hip fracture. Patient was admitted to [facility] from May 8-16 where she underwent a left hip intramedullary nailing for a left femur intertrochanteric fracture on 09/29/2016. Hospital course was complicated by a UTI and a left thigh hematoma. An acute blood loss from hematoma and from the postoperative period required a transfusion. Patient is admitted to skilled nursing facility with generalized weakness for OT/PT and pt is now ready to be d/c to home.    Past Medical History:  Diagnosis Date  . Acid reflux   . Atrial fibrillation (HCC)   . Carpal tunnel syndrome   . Cervical spondylosis without myelopathy   . Closed fracture of second metatarsal bone of right foot   . GERD (gastroesophageal reflux disease)   . Hyperlipidemia   . Hypothyroidism   . Idiopathic progressive polyneuropathy   . Lumbosacral spondylosis without myelopathy   . Nerve damage    both legs and feet  . Occlusion and stenosis of unspecified carotid artery    without mention of cerebral infarction  . Polymyalgia rheumatica  (HCC)   . Stroke (HCC)   . Syncope   . Vertebral artery syndrome     Past Surgical History:  Procedure Laterality Date  . CARPAL TUNNEL RELEASE    . SUBCLAVIAN STENT PLACEMENT       reports that she has never smoked. She has never used smokeless tobacco. She reports that she does not drink alcohol or use drugs. Social History   Social History  . Marital status: Widowed    Spouse name: N/A  . Number of children: N/A  . Years of education: N/A   Occupational History  . Not on file.   Social History Main Topics  . Smoking status: Never Smoker  . Smokeless tobacco: Never Used  . Alcohol use No  . Drug use: No  . Sexual activity: Not on file   Other Topics Concern  . Not on file   Social History Narrative  . No narrative on file    Pertinent  Health Maintenance Due  Topic Date Due  . DEXA SCAN  09/07/1995  . INFLUENZA VACCINE  12/22/2016  . PNA vac Low Risk Adult  Completed    Medications: Allergies as of 10/25/2016      Reactions   Bactrim [sulfamethoxazole-trimethoprim]    Macrobid [nitrofurantoin Monohyd Macro]    Penicillins       Medication List       Accurate as of 10/25/16  5:05 PM. Always use your most recent med list.  atorvastatin 40 MG tablet Commonly known as:  LIPITOR Take 40 mg by mouth every evening.   bisacodyl 5 MG EC tablet Commonly known as:  DULCOLAX Take 10 mg by mouth daily as needed.   CALTRATE 600+D3 SOFT PO Take 1 tablet by mouth every morning.   DULoxetine 60 MG capsule Commonly known as:  CYMBALTA Take 60 mg by mouth daily.   gabapentin 300 MG capsule Commonly known as:  NEURONTIN Take 300 mg by mouth at bedtime.   levothyroxine 50 MCG tablet Commonly known as:  SYNTHROID, LEVOTHROID Take 50 mcg by mouth daily before breakfast.   metoprolol succinate 25 MG 24 hr tablet Commonly known as:  TOPROL-XL Take 12.5 mg by mouth daily.   multivitamin tablet Take 1 tablet by mouth every morning.   omeprazole 40  MG capsule Commonly known as:  PRILOSEC Take 40 mg by mouth daily.   Rivaroxaban 15 MG Tabs tablet Commonly known as:  XARELTO Take 15 mg by mouth daily with supper.   traMADol 50 MG tablet Commonly known as:  ULTRAM Take 50 mg by mouth daily as needed.        Vitals:   10/25/16 0858  BP: 135/74  Pulse: 75  Resp: 18  Temp: 97.3 F (36.3 C)  SpO2: 98%  Weight: 146 lb 3.2 oz (66.3 kg)  Height: 5\' 3"  (1.6 m)   Body mass index is 25.9 kg/m.  Physical Exam  GENERAL APPEARANCE: Alert, conversant. No acute distress.  HEENT: Unremarkable. RESPIRATORY: Breathing is even, unlabored. Lung sounds are clear   CARDIOVASCULAR: Heart RRR no murmurs, rubs or gallops. No peripheral edema.  GASTROINTESTINAL: Abdomen is soft, non-tender, not distended w/ normal bowel sounds.  NEUROLOGIC: Cranial nerves 2-12 grossly intact. Moves all extremities   Labs reviewed: Basic Metabolic Panel:  Recent Labs  40/98/1110/23/17 10/03/16 10/11/16  NA 143 133* 139  K 4.3 3.6 3.6  BUN 16 11 15   CREATININE 1.3* 1.0 1.2*   [facility] results found for: [facility] Liver Function Tests:  Recent Labs  03/05/16 03/15/16 09/28/16  AST 23 30 35  ALT 9 15 16   ALKPHOS 62 79 0.5*   No results for input(s): LIPASE, AMYLASE in the last 8760 hours. No results for input(s): AMMONIA in the last 8760 hours. CBC:  Recent Labs  03/15/16 10/06/16 10/11/16  WBC 6.4 10.3 11.3  NEUTROABS 4  --   --   HGB 11.0* 11.3* 11.9*  HCT 34* 33* 37  PLT 256 245 355   Lipid No results for input(s): CHOL, HDL, LDLCALC, TRIG in the last 8760 hours. Cardiac Enzymes: No results for input(s): CKTOTAL, CKMB, CKMBINDEX, TROPONINI in the last 8760 hours. BNP: No results for input(s): BNP in the last 8760 hours. CBG: No results for input(s): GLUCAP in the last 8760 hours.  Procedures and Imaging Studies During Stay: No results found.  Assessment/Plan:   Closed nondisplaced intertrochanteric fracture of left femur with routine  healing, subsequent encounter  Acute blood loss as cause of postoperative anemia  Status post total replacement of left hip  Thigh hematoma, left, subsequent encounter  E. coli UTI (urinary tract infection)  Persistent atrial fibrillation (HCC)  Gastroesophageal reflux disease without esophagitis  Depression, unspecified depression type  Hypothyroidism due to acquired atrophy of thyroid   Patient is being discharged with the following home health services: OT/PT/Nursing   Patient is being discharged with the following durable medical equipment: standard WC   Patient has been advised to f/u with their PCP  in 1-2 weeks to bring them up to date on their rehab stay.  Social services at facility was responsible for arranging this appointment.  Pt was provided with a 30 day supply of prescriptions for medications and refills must be obtained from their PCP.  For controlled substances, a more limited supply may be provided adequate until PCP appointment only.  Medications have been verified.   Time spent > 30 min;> 50% of time with patient was spent reviewing records, labs, tests and studies, counseling and developing plan of care  Randon Goldsmith. Lyn Hollingshead, MD

## 2016-12-04 ENCOUNTER — Encounter: Payer: Self-pay | Admitting: Internal Medicine

## 2016-12-27 ENCOUNTER — Other Ambulatory Visit: Payer: Self-pay | Admitting: Adult Health

## 2019-11-05 ENCOUNTER — Emergency Department (HOSPITAL_COMMUNITY): Payer: Medicare HMO

## 2019-11-05 ENCOUNTER — Other Ambulatory Visit: Payer: Self-pay

## 2019-11-05 ENCOUNTER — Encounter (HOSPITAL_COMMUNITY): Payer: Self-pay | Admitting: *Deleted

## 2019-11-05 ENCOUNTER — Emergency Department (HOSPITAL_COMMUNITY)
Admission: EM | Admit: 2019-11-05 | Discharge: 2019-11-12 | Disposition: A | Discharge status: Home / Self Care | Payer: Medicare HMO | Attending: Emergency Medicine | Admitting: Emergency Medicine

## 2019-11-05 DIAGNOSIS — Z751 Person awaiting admission to adequate facility elsewhere: Secondary | ICD-10-CM | POA: Insufficient documentation

## 2019-11-05 DIAGNOSIS — I4891 Unspecified atrial fibrillation: Secondary | ICD-10-CM | POA: Diagnosis not present

## 2019-11-05 DIAGNOSIS — Z79899 Other long term (current) drug therapy: Secondary | ICD-10-CM | POA: Diagnosis not present

## 2019-11-05 DIAGNOSIS — E860 Dehydration: Secondary | ICD-10-CM | POA: Diagnosis not present

## 2019-11-05 DIAGNOSIS — R4182 Altered mental status, unspecified: Secondary | ICD-10-CM | POA: Diagnosis present

## 2019-11-05 DIAGNOSIS — E039 Hypothyroidism, unspecified: Secondary | ICD-10-CM | POA: Insufficient documentation

## 2019-11-05 DIAGNOSIS — W19XXXD Unspecified fall, subsequent encounter: Secondary | ICD-10-CM | POA: Insufficient documentation

## 2019-11-05 DIAGNOSIS — M47812 Spondylosis without myelopathy or radiculopathy, cervical region: Secondary | ICD-10-CM | POA: Diagnosis not present

## 2019-11-05 DIAGNOSIS — K219 Gastro-esophageal reflux disease without esophagitis: Secondary | ICD-10-CM | POA: Diagnosis not present

## 2019-11-05 DIAGNOSIS — Z8673 Personal history of transient ischemic attack (TIA), and cerebral infarction without residual deficits: Secondary | ICD-10-CM | POA: Insufficient documentation

## 2019-11-05 DIAGNOSIS — S42017A Nondisplaced fracture of sternal end of right clavicle, initial encounter for closed fracture: Secondary | ICD-10-CM

## 2019-11-05 DIAGNOSIS — R4781 Slurred speech: Secondary | ICD-10-CM | POA: Insufficient documentation

## 2019-11-05 DIAGNOSIS — M47029 Vertebral artery compression syndromes, site unspecified: Secondary | ICD-10-CM | POA: Diagnosis not present

## 2019-11-05 DIAGNOSIS — R531 Weakness: Secondary | ICD-10-CM

## 2019-11-05 DIAGNOSIS — F329 Major depressive disorder, single episode, unspecified: Secondary | ICD-10-CM | POA: Insufficient documentation

## 2019-11-05 DIAGNOSIS — E785 Hyperlipidemia, unspecified: Secondary | ICD-10-CM | POA: Insufficient documentation

## 2019-11-05 LAB — COMPREHENSIVE METABOLIC PANEL
ALT: 12 U/L (ref 0–44)
AST: 29 U/L (ref 15–41)
Albumin: 2.7 g/dL — ABNORMAL LOW (ref 3.5–5.0)
Alkaline Phosphatase: 76 U/L (ref 38–126)
Anion gap: 10 (ref 5–15)
BUN: 24 mg/dL — ABNORMAL HIGH (ref 8–23)
CO2: 21 mmol/L — ABNORMAL LOW (ref 22–32)
Calcium: 8.9 mg/dL (ref 8.9–10.3)
Chloride: 110 mmol/L (ref 98–111)
Creatinine, Ser: 1.57 mg/dL — ABNORMAL HIGH (ref 0.44–1.00)
GFR calc Af Amer: 34 mL/min — ABNORMAL LOW (ref >60)
GFR calc non Af Amer: 29 mL/min — ABNORMAL LOW (ref >60)
Glucose, Bld: 107 mg/dL — ABNORMAL HIGH (ref 70–99)
Potassium: 4.3 mmol/L (ref 3.5–5.1)
Sodium: 141 mmol/L (ref 135–145)
Total Bilirubin: 0.5 mg/dL (ref 0.3–1.2)
Total Protein: 7.2 g/dL (ref 6.5–8.1)

## 2019-11-05 LAB — CBC WITH DIFFERENTIAL/PLATELET
Abs Immature Granulocytes: 0.04 10*3/uL (ref 0.00–0.07)
Basophils Absolute: 0.1 10*3/uL (ref 0.0–0.1)
Basophils Relative: 1 %
Eosinophils Absolute: 0.2 10*3/uL (ref 0.0–0.5)
Eosinophils Relative: 2 %
HCT: 31.2 % — ABNORMAL LOW (ref 36.0–46.0)
Hemoglobin: 9.8 g/dL — ABNORMAL LOW (ref 12.0–15.0)
Immature Granulocytes: 1 %
Lymphocytes Relative: 30 %
Lymphs Abs: 2.1 10*3/uL (ref 0.7–4.0)
MCH: 30.3 pg (ref 26.0–34.0)
MCHC: 31.4 g/dL (ref 30.0–36.0)
MCV: 96.6 fL (ref 80.0–100.0)
Monocytes Absolute: 0.7 10*3/uL (ref 0.1–1.0)
Monocytes Relative: 10 %
Neutro Abs: 4 10*3/uL (ref 1.7–7.7)
Neutrophils Relative %: 56 %
Platelets: 302 10*3/uL (ref 150–400)
RBC: 3.23 MIL/uL — ABNORMAL LOW (ref 3.87–5.11)
RDW: 19 % — ABNORMAL HIGH (ref 11.5–15.5)
WBC: 7.1 10*3/uL (ref 4.0–10.5)
nRBC: 0 % (ref 0.0–0.2)

## 2019-11-05 LAB — PROTIME-INR
INR: 1 (ref 0.8–1.2)
Prothrombin Time: 13.2 seconds (ref 11.4–15.2)

## 2019-11-05 LAB — ETHANOL: Alcohol, Ethyl (B): <10 mg/dL (ref <10)

## 2019-11-05 LAB — CBG MONITORING, ED: Glucose-Capillary: 96 mg/dL (ref 70–99)

## 2019-11-05 MED ORDER — RIVAROXABAN 15 MG PO TABS: 15.0000 mg | ORAL_TABLET | Freq: Every day | ORAL | Status: DC

## 2019-11-05 MED ORDER — PAROXETINE HCL 20 MG PO TABS
20.0000 mg | ORAL_TABLET | Freq: Every day | ORAL | Status: DC
  Administered 2019-11-05 – 2019-11-12 (×8): 20 mg via ORAL
  Filled 2019-11-05 (×11): qty 1

## 2019-11-05 MED ORDER — SODIUM CHLORIDE 0.9% FLUSH: 3.0000 mL | Freq: Once | INTRAVENOUS | Status: DC

## 2019-11-05 MED ORDER — SODIUM CHLORIDE 0.9 % IV SOLN: INTRAVENOUS | Status: DC

## 2019-11-05 MED ORDER — METOPROLOL SUCCINATE ER 25 MG PO TB24
12.5000 mg | ORAL_TABLET | Freq: Every day | ORAL | Status: DC
  Administered 2019-11-05 – 2019-11-12 (×7): 12.5 mg via ORAL
  Filled 2019-11-05 (×7): qty 1

## 2019-11-05 MED ORDER — LEVOTHYROXINE SODIUM 50 MCG PO TABS
50.0000 ug | ORAL_TABLET | Freq: Every day | ORAL | Status: DC
  Administered 2019-11-06 – 2019-11-12 (×7): 50 ug via ORAL
  Filled 2019-11-05 (×7): qty 1

## 2019-11-05 MED ORDER — BISACODYL 5 MG PO TBEC: 10.0000 mg | DELAYED_RELEASE_TABLET | Freq: Every day | ORAL | Status: DC | PRN

## 2019-11-05 MED ORDER — GABAPENTIN 100 MG PO CAPS
300.0000 mg | ORAL_CAPSULE | Freq: Every day | ORAL | Status: DC
  Administered 2019-11-05 – 2019-11-11 (×7): 300 mg via ORAL
  Filled 2019-11-05: qty 3
  Filled 2019-11-05: qty 1
  Filled 2019-11-05 (×5): qty 3

## 2019-11-05 MED ORDER — SODIUM CHLORIDE 0.9 % IV BOLUS
1000.0000 mL | Freq: Once | INTRAVENOUS | Status: AC
  Administered 2019-11-05: 1000 mL via INTRAVENOUS

## 2019-11-05 MED ORDER — DULOXETINE HCL 60 MG PO CPEP: 60.0000 mg | ORAL_CAPSULE | Freq: Every day | ORAL | Status: DC

## 2019-11-05 MED ORDER — ATORVASTATIN CALCIUM 40 MG PO TABS
40.0000 mg | ORAL_TABLET | Freq: Every evening | ORAL | Status: DC
  Administered 2019-11-05 – 2019-11-11 (×6): 40 mg via ORAL
  Filled 2019-11-05 (×7): qty 1

## 2019-11-05 MED ORDER — PANTOPRAZOLE SODIUM 40 MG PO TBEC
40.0000 mg | DELAYED_RELEASE_TABLET | Freq: Every day | ORAL | Status: DC
  Administered 2019-11-05 – 2019-11-12 (×8): 40 mg via ORAL
  Filled 2019-11-05 (×8): qty 1

## 2019-11-05 MED ORDER — TRAMADOL HCL 50 MG PO TABS
50.0000 mg | ORAL_TABLET | Freq: Two times a day (BID) | ORAL | Status: DC | PRN
  Administered 2019-11-09 – 2019-11-10 (×2): 50 mg via ORAL
  Filled 2019-11-05 (×2): qty 1

## 2019-11-05 NOTE — ED Triage Notes (Signed)
Per [facility] pt from home, family reports fall 2 weeks ago sen [facility] ED and dc'd home, AMS since fall getting progressively worse over the last 2 weeks. HH CNA and daughter report Left side facial droop yesterday associated with Left side weakness sometime yesterday. Unsure of when symptoms started. HH CNA reports pt LKW Friday.   CBG 136 BP 148/54 RR 20 HR 60 98% RA  Temp 97.9

## 2019-11-05 NOTE — [place] FL2 (Signed)
[place] MEDICAID FL2 LEVEL OF CARE SCREENING TOOL     IDENTIFICATION  Patient Name: Erin Fisher Birthdate: 1929/02/22 Sex: female Admission Date (Current Location): 11/05/2019  [facility] and [place] Number:  Herbalist and Address:  The . [facility], [place] [address], [place], [place] [address]      Provider Number: [id number]  Attending Physician Name and Address:  Default, Provider, MD  Relative Name and Phone Number:  Zerita Boers Daughter [phone]    Current Level of Care: Hospital Recommended Level of Care: [place] Prior Approval Number:    Date Approved/Denied:   PASRR Number: Pending  Discharge Plan: SNF    Current Diagnoses: Patient Active Problem List   Diagnosis Date Noted  . Hypothyroidism 10/25/2016  . S/P total hip arthroplasty 10/08/2016  . Thigh hematoma, left, subsequent encounter 10/08/2016  . Acute blood loss as cause of postoperative anemia 10/08/2016  . Depression 10/08/2016  . GERD (gastroesophageal reflux disease)   . Carpal tunnel syndrome   . Cervical spondylosis without myelopathy   . Hyperlipidemia   . Idiopathic progressive polyneuropathy   . Lumbosacral spondylosis without myelopathy   . Polymyalgia rheumatica ([place])   . Stroke ([place])   . Vertebral artery syndrome   . Closed intertrochanteric fracture of left femur ([place]) 09/28/2016  . Recurrent UTI 06/03/2016  . PAF (paroxysmal atrial fibrillation) ([place]) 03/22/2016  . History of CVA (cerebrovascular accident) 03/22/2016  . PVD (peripheral vascular disease) ([place]) 10/15/2015  . E. coli UTI (urinary tract infection) 10/07/2014  . Persistent atrial fibrillation ([place]) 10/07/2014    Orientation RESPIRATION BLADDER Height & Weight      (Pt has Dx of dementia pleasantly confused)  Normal Incontinent Weight: 146 lb 2.6 oz (66.3 kg) Height:  5\' 3"  (160 cm)  BEHAVIORAL SYMPTOMS/MOOD NEUROLOGICAL BOWEL NUTRITION STATUS      Continent Diet  (Renal)  AMBULATORY STATUS COMMUNICATION OF NEEDS Skin   Extensive Assist Verbally Bruising, Skin abrasions (left hadn lac due to fall)                       Personal Care Assistance Level of Assistance  Bathing, Feeding, Dressing Bathing Assistance: Maximum assistance Feeding assistance: Limited assistance Dressing Assistance: Maximum assistance     Functional Limitations Info  Sight, Hearing, Speech Sight Info: Adequate Hearing Info: Adequate Speech Info: Adequate    SPECIAL CARE FACTORS FREQUENCY  PT (By licensed PT), OT (By licensed OT)     PT Frequency: 5x weekly OT Frequency: 5x weekly            Contractures      Additional Factors Info  Code Status, Allergies Code Status Info: DNR Allergies Info: Bactrim, Macrobid,Nsaids,Penicillins           Current Medications (11/05/2019):  This is the current hospital active medication list Current Facility-Administered Medications  Medication Dose Route Frequency Provider Last Rate Last Admin  . 0.9 %  sodium chloride infusion   Intravenous Continuous Carmin Muskrat, MD 125 mL/hr at 11/05/19 1306 New Bag at 11/05/19 1306  . atorvastatin (LIPITOR) tablet 40 mg  40 mg Oral QPM Carmin Muskrat, MD      . bisacodyl (DULCOLAX) EC tablet 10 mg  10 mg Oral Daily PRN Carmin Muskrat, MD      . gabapentin (NEURONTIN) capsule 300 mg  300 mg Oral QHS Carmin Muskrat, MD      . Derrill Memo ON 11/06/2019] levothyroxine (SYNTHROID) tablet 50 mcg  50  mcg Oral QAC breakfast Gerhard Munch, MD      . metoprolol succinate (TOPROL-XL) 24 hr tablet 12.5 mg  12.5 mg Oral Daily Gerhard Munch, MD      . pantoprazole (PROTONIX) EC tablet 40 mg  40 mg Oral Daily Gerhard Munch, MD      . PARoxetine (PAXIL) tablet 20 mg  20 mg Oral Daily Mesner, Jason, MD      . sodium chloride flush (NS) 0.9 % injection 3 mL  3 mL Intravenous Once Gerhard Munch, MD      . traMADol Janean Sark) tablet 50 mg  50 mg Oral Q12H PRN Gerhard Munch, MD        Current Outpatient Medications  Medication Sig Dispense Refill  . acetaminophen (TYLENOL) 325 MG tablet Take 650 mg by mouth every 6 (six) hours as needed for headache (pain).    Marland Kitchen gabapentin (NEURONTIN) 100 MG capsule Take 100 mg by mouth at bedtime.    Marland Kitchen levothyroxine (SYNTHROID) 75 MCG tablet Take 75 mcg by mouth daily before breakfast.    . LORazepam (ATIVAN) 0.5 MG tablet Take 0.5 mg by mouth at bedtime.    . metoprolol succinate (TOPROL-XL) 25 MG 24 hr tablet Take 12.5 mg by mouth daily after lunch.     Marland Kitchen omeprazole (PRILOSEC) 40 MG capsule Take 40 mg by mouth daily with breakfast.     . PARoxetine (PAXIL) 20 MG tablet Take 20 mg by mouth daily after lunch.       Discharge Medications: Please see discharge summary for a list of discharge medications.  Relevant Imaging Results:  Relevant Lab Results:   Additional Information SSN [id number]  Shella Spearing, LCSW

## 2019-11-05 NOTE — ED Notes (Signed)
Daughter Melina Schools updated on pt condition/dispo

## 2019-11-05 NOTE — ED Provider Notes (Signed)
[facility] EMERGENCY DEPARTMENT Provider Note   CSN: [id number] Arrival date & time:        History Chief Complaint  Patient presents with  . Altered Mental Status    Erin Fisher is a 86 y.o. female.  HPI    Patient presents via EMS from home.  Family reports a fall 2 weeks ago, following which she was seen at a different emergency department and sent home.  Seemingly the patient has had weakness since about that time worsening over the past 2 weeks.  Home health nurse also report possible left-sided facial droop, and weakness, possibly yesterday.  Seemingly the last seen normal time was 3 days ago. History is obtained by EMS providers, as above, and the patient herself minimally.  The patient answer some questions, briefly, after a delay, seemingly denies pain, weakness, cannot provide insight into history over the past few days, no changes today, level 5 caveat secondary to mental status changes. Past Medical History:  Diagnosis Date  . Acid reflux   . Atrial fibrillation (HCC)   . Carpal tunnel syndrome   . Cervical spondylosis without myelopathy   . Closed fracture of second metatarsal bone of right foot   . GERD (gastroesophageal reflux disease)   . Hyperlipidemia   . Hypothyroidism   . Idiopathic progressive polyneuropathy   . Lumbosacral spondylosis without myelopathy   . Nerve damage    both legs and feet  . Occlusion and stenosis of unspecified carotid artery    without mention of cerebral infarction  . Polymyalgia rheumatica (HCC)   . Stroke (HCC)   . Syncope   . Vertebral artery syndrome     Patient Active Problem List   Diagnosis Date Noted  . Hypothyroidism 10/25/2016  . S/P total hip arthroplasty 10/08/2016  . Thigh hematoma, left, subsequent encounter 10/08/2016  . Acute blood loss as cause of postoperative anemia 10/08/2016  . Depression 10/08/2016  . GERD (gastroesophageal reflux disease)   . Carpal tunnel syndrome   .  Cervical spondylosis without myelopathy   . Hyperlipidemia   . Idiopathic progressive polyneuropathy   . Lumbosacral spondylosis without myelopathy   . Polymyalgia rheumatica (HCC)   . Stroke (HCC)   . Vertebral artery syndrome   . Closed intertrochanteric fracture of left femur (HCC) 09/28/2016  . Recurrent UTI 06/03/2016  . PAF (paroxysmal atrial fibrillation) (HCC) 03/22/2016  . History of CVA (cerebrovascular accident) 03/22/2016  . PVD (peripheral vascular disease) (HCC) 10/15/2015  . E. coli UTI (urinary tract infection) 10/07/2014  . Persistent atrial fibrillation (HCC) 10/07/2014    Past Surgical History:  Procedure Laterality Date  . CARPAL TUNNEL RELEASE    . SUBCLAVIAN STENT PLACEMENT       OB History   No obstetric history on file.     Family History  Problem Relation Age of Onset  . Heart disease Mother   . Arthritis Mother   . Heart disease Father   . Cancer Daughter     Social History   Tobacco Use  . Smoking status: Never Smoker  . Smokeless tobacco: Never Used  Vaping Use  . Vaping Use: Never used  Substance Use Topics  . Alcohol use: No  . Drug use: No    Home Medications Prior to Admission medications   Medication Sig Start Date End Date Taking? Authorizing Provider  atorvastatin (LIPITOR) 40 MG tablet Take 40 mg by mouth every evening.     [provider]  bisacodyl (DULCOLAX)  5 MG EC tablet Take 10 mg by mouth daily as needed.    [provider]  Calcium Carb-Cholecalciferol (CALTRATE 600+D3 SOFT PO) Take 1 tablet by mouth every morning.     [provider]  DULoxetine (CYMBALTA) 60 MG capsule Take 60 mg by mouth daily.     [provider]  gabapentin (NEURONTIN) 300 MG capsule Take 300 mg by mouth at bedtime.     [provider]  levothyroxine (SYNTHROID, LEVOTHROID) 50 MCG tablet Take 50 mcg by mouth daily before breakfast.     [provider]  metoprolol succinate (TOPROL-XL) 25 MG 24  hr tablet Take 12.5 mg by mouth daily.     [provider]  Multiple Vitamin (MULTIVITAMIN) tablet Take 1 tablet by mouth every morning.     [provider]  omeprazole (PRILOSEC) 40 MG capsule Take 40 mg by mouth daily.     [provider]  Rivaroxaban (XARELTO) 15 MG TABS tablet Take 15 mg by mouth daily with supper.     [provider]  traMADol (ULTRAM) 50 MG tablet Take 50 mg by mouth daily as needed.     [provider]    Allergies    Bactrim [sulfamethoxazole-trimethoprim], Macrobid [nitrofurantoin monohyd macro], and Penicillins  Review of Systems   Review of Systems  Unable to perform ROS: Mental status change    Physical Exam Updated Vital Signs BP (!) 154/43 (BP Location: Right Arm)   Pulse (!) 59   Temp 98.1 F (36.7 C) (Oral)   Resp 14   Ht 5\' 3"  (1.6 m)   Wt 66.3 kg   SpO2 100%   BMI 25.89 kg/m   Physical Exam Vitals and nursing note reviewed.  Constitutional:      Appearance: She is well-developed. She is ill-appearing.     Comments: Withdrawn elderly female answer questions occasionally, briefly  HENT:     Head: Normocephalic and atraumatic.  Eyes:     Conjunctiva/sclera: Conjunctivae normal.  Cardiovascular:     Rate and Rhythm: Normal rate and regular rhythm.     Heart sounds: Murmur heard.   Pulmonary:     Effort: Pulmonary effort is normal. No respiratory distress.     Breath sounds: Normal breath sounds. No stridor.  Abdominal:     General: There is no distension.     Tenderness: There is no abdominal tenderness. There is no guarding.  Musculoskeletal:        General: No deformity or signs of injury.     Right lower leg: No edema.     Left lower leg: No edema.  Skin:    General: Skin is warm and dry.  Neurological:     Cranial Nerves: No cranial nerve deficit.     Comments: No facial asymmetry, speech, when she speaks in brief, clear.  She answers questions only inconsistently.  She moves all 4  extremities spontaneously, and has 3/5 strength in both upper, 4/5 strength in both lower extremities distally.  Psychiatric:        Behavior: Behavior is slowed and withdrawn.        Cognition and Memory: Cognition is impaired.      ED Results / Procedures / Treatments   Labs (all labs ordered are listed, but only abnormal results are displayed) Labs Reviewed  COMPREHENSIVE METABOLIC PANEL - Abnormal; Notable for the following components:      Result Value   CO2 21 (*)    Glucose, Bld 107 (*)  BUN 24 (*)    Creatinine, Ser 1.57 (*)    Albumin 2.7 (*)    GFR calc non Af Amer 29 (*)    GFR calc Af Amer 34 (*)    All other components within normal limits  CBC WITH DIFFERENTIAL/PLATELET - Abnormal; Notable for the following components:   RBC 3.23 (*)    Hemoglobin 9.8 (*)    HCT 31.2 (*)    RDW 19.0 (*)    All other components within normal limits  ETHANOL  PROTIME-INR  RAPID URINE DRUG SCREEN, HOSP PERFORMED  URINALYSIS, COMPLETE (UACMP) WITH MICROSCOPIC  CBG MONITORING, ED  CBG MONITORING, ED    EKG EKG Interpretation  Date/Time:  Monday November 05 2019 11:11:45 EDT Ventricular Rate:  59 PR Interval:    QRS Duration: 91 QT Interval:  450 QTC Calculation: 446 R Axis:   -8 Text Interpretation: regular narrow qrs likely sinus rhythym Artifact T wave abnormality Abnormal ECG Confirmed by Carmin Muskrat [phone]) on 11/05/2019 12:39:35 PM   Radiology CT Head Wo Contrast  Result Date: 11/05/2019 CLINICAL DATA:  86 year old female with fall 2 weeks ago. Progressive altered mental status. Left side facial droop and weakness noticed yesterday. EXAM: CT HEAD WITHOUT CONTRAST TECHNIQUE: Contiguous axial images were obtained from the base of the skull through the vertex without intravenous contrast. COMPARISON:  [facility] head CT 10/20/2019. FINDINGS: Brain: Stable cerebral volume with chronic encephalomalacia in both the posterior right  MCA and the left PCA territories. Patchy chronic left cerebellar infarcts also noted and stable. Stable gray-white matter differentiation throughout the brain. No midline shift, ventriculomegaly, mass effect, evidence of mass lesion, intracranial hemorrhage or evidence of cortically based acute infarction. Vascular: Calcified atherosclerosis at the skull base. No suspicious intracranial vascular hyperdensity. Skull: Stable and intact. Sinuses/Orbits: Visualized paranasal sinuses and mastoids are stable and well pneumatized. Other: Resolved left anterior scalp hematoma since last month. Stable and negative orbits. IMPRESSION: 1. No acute intracranial abnormality. 2. Chronic infarcts in the posterior right MCA, left PCA, and left cerebellar artery territories appear stable. 3. Resolved left scalp hematoma since last month. Electronically Signed   By: Genevie Ann M.D.   On: 11/05/2019 13:57   DG Chest Port 1 View  Result Date: 11/05/2019 CLINICAL DATA:  Altered mental status.  Recent fall EXAM: PORTABLE CHEST 1 VIEW COMPARISON:  April 05, 2018 FINDINGS: There is apparent scarring in the right upper lobe. Lungs elsewhere clear. Heart size and pulmonary vascularity are normal. No adenopathy. There is aortic atherosclerosis. No adenopathy. No bone lesions. Stent noted in left brachial region. Previously noted hiatal hernia not well seen currently. IMPRESSION: Scarring right upper lobe. Lungs otherwise clear. Heart size normal. No adenopathy. Previously noted hiatal hernia not well seen on this examination. Aortic Atherosclerosis (ICD10-I70.0). Electronically Signed   By: Lowella Grip III M.D.   On: 11/05/2019 11:28    Procedures Procedures (including critical care time)  Medications Ordered in ED Medications  sodium chloride flush (NS) 0.9 % injection 3 mL (3 mLs Intravenous Not Given 11/05/19 1200)  sodium chloride 0.9 % bolus 1,000 mL (0 mLs Intravenous Stopped 11/05/19 1305)    And  0.9 %  sodium  chloride infusion ( Intravenous New Bag/Given 11/05/19 1306)    ED Course  I have reviewed the triage vital signs and the nursing notes.  Pertinent labs & imaging results that were available during my care of the patient were reviewed by me and  considered in my medical decision making (see chart for details).  Though the patient is in no distress, has no facial asymmetry, no gross weakness on initial exam, with consideration of TIA versus stroke versus infection, versus bleed given her history of fall 2 weeks ago, broad differential, labs, CT, x-ray all ordered after initial evaluation.   Update:, Daughter not present currently she notes that she no longer can care for this patient. Patient has had notable decline over the past few weeks following fall, evaluation at [facility]. She notes that the patient now has idiosyncratic behavior, difficulty with balance, inability to live at home with the daughter.  2:49 PM  Patient is in similar condition, in no distress, no gross facial asymmetry. Findings here are reassuring in general, no substantial dehydration, no CT evidence for acute new stroke, and given the absence of gross neurologic deficiencies, there is further diminishing of suspicion for this. Given consideration of weakness, recrudescence, and inability to stand home, patient may be a candidate for placement, which was discussed with our social work team. No indication for medical admission.  MDM Number of Diagnoses or Management Options Dehydration: new, needed workup Weakness: new, needed workup   Amount and/or Complexity of Data Reviewed Clinical lab tests: reviewed Tests in the radiology section of CPT: reviewed Tests in the medicine section of CPT: reviewed Decide to obtain previous medical records or to obtain history from someone other than the patient: yes Obtain history from someone other than the patient: yes Review and summarize past medical  records: yes Discuss the patient with other providers: yes Independent visualization of images, tracings, or specimens: yes  Risk of Complications, Morbidity, and/or Mortality Presenting problems: high Diagnostic procedures: high Management options: high  Critical Care Total time providing critical care: < 30 minutes  Patient Progress Patient progress: stable  Final Clinical Impression(s) / ED Diagnoses Final diagnoses:  Weakness  Dehydration     Gerhard Munch, MD 11/05/19 [phone]

## 2019-11-05 NOTE — Progress Notes (Addendum)
PASRR will needs a review. CSW faxed FL2, PT notes as doctor notes to [facility] must.  CSW also faxed out information to area SNFs with PASRR pending

## 2019-11-05 NOTE — TOC Initial Note (Signed)
Transition of Care [facility]) - Initial/Assessment Note    Patient Details  Name: Erin Fisher MRN: [id number] Date of Birth: 1929-01-15  Transition of Care [facility]) CM/SW Contact:    Vergie Living, LCSW Phone Number: 11/05/2019, 5:16 PM  Clinical Narrative:                 CSW met with Pt and daughter at bedside. Pt is confused, yet pleasant; unable to state her name.  Daughter reports that increased confusion is new in the last 2 weeks after most recent fall. CSW will begin SNF placement workup in accordance with PT recommendation.  Expected Discharge Plan: Skilled Nursing Facility Barriers to Discharge: Continued Medical Work up   Patient Goals and CMS Choice Patient states their goals for this hospitalization and ongoing recovery are:: Get stronger CMS Medicare.gov Compare Post Acute Care list provided to:: Patient Represenative (must comment) Zerita Boers Daughter [phone]) Choice offered to / list presented to : Adult Children Zerita Boers Daughter [phone])  Expected Discharge Plan and Services Expected Discharge Plan: [place] Choice: [place] arrangements for the past 2 months: Single Family Home                                      Prior Living Arrangements/Services Living arrangements for the past 2 months: Single Family Home Lives with:: Adult Children Patient language and need for interpreter reviewed:: No Do you feel safe going back to the place where you live?: Yes      Need for Family Participation in Patient Care: Yes (Comment) Care giver support system in place?: Yes (comment)   Criminal Activity/Legal Involvement Pertinent to Current Situation/Hospitalization: No - Comment as needed  Activities of Daily Living      Permission Sought/Granted Permission sought to share information with : Family Supports ([place] Daughter [phone]) Permission granted to share  information with : Yes, Verbal Permission Granted (Pt has dementia Dx. Daughter is PoA and was present during assessment)  Share Information with NAME: Zerita Boers Daughter [phone]     Permission granted to share info w Relationship: White,Sharon Daughter [phone]  Permission granted to share info w Contact Information: Zerita Boers Daughter [phone]  Emotional Assessment Appearance:: Appears stated age Attitude/Demeanor/Rapport: Other (comment) (Pt is pleasantly confused) Affect (typically observed): Pleasant Orientation: :  (Pt not oriented to self or situation) Alcohol / Substance Use: Not Applicable Psych Involvement: No (comment)  Admission diagnosis:  stroke like sx/ams Patient Active Problem List   Diagnosis Date Noted  . Hypothyroidism 10/25/2016  . S/P total hip arthroplasty 10/08/2016  . Thigh hematoma, left, subsequent encounter 10/08/2016  . Acute blood loss as cause of postoperative anemia 10/08/2016  . Depression 10/08/2016  . GERD (gastroesophageal reflux disease)   . Carpal tunnel syndrome   . Cervical spondylosis without myelopathy   . Hyperlipidemia   . Idiopathic progressive polyneuropathy   . Lumbosacral spondylosis without myelopathy   . Polymyalgia rheumatica ([place])   . Stroke ([place])   . Vertebral artery syndrome   . Closed intertrochanteric fracture of left femur ([place]) 09/28/2016  . Recurrent UTI 06/03/2016  . PAF (paroxysmal atrial fibrillation) ([place]) 03/22/2016  . History of CVA (cerebrovascular accident) 03/22/2016  . PVD (peripheral vascular disease) ([place]) 10/15/2015  . E. coli UTI (urinary tract infection) 10/07/2014  . Persistent atrial fibrillation ([place]) 10/07/2014   PCP:  Loraine Leriche., MD  Pharmacy:   [facility] [place], [address] [place] [address] Phone: [phone] Fax: [phone]     Social Determinants of Health (SDOH) Interventions     Readmission Risk Interventions No flowsheet data found.

## 2019-11-05 NOTE — ED Notes (Signed)
Supper tray given to patient.

## 2019-11-05 NOTE — Evaluation (Signed)
Physical Therapy Evaluation Patient Details Name: Erin Fisher MRN: [id number] DOB: [birth year] Today's Date: 11/05/2019   History of Present Illness  Patient presents via EMS from home.  Family reports a fall 2 weeks ago, following which she was seen at a different emergency department and sent home.  Seemingly the patient has had weakness since about that time worsening over the past 2 weeks.  Home health nurse also report possible left-sided facial droop, and weakness, possibly yesterday.   Clinical Impression  Pt admitted with above diagnosis. Pt was only able to perform bed level eval due to left UE and LE weakness as well as AMS making it difficult for pt to follow commands. Daughter states that she cannot continue to care for pt 24 hours day. PT recommend SNF for pt.   Pt currently with functional limitations due to the deficits listed below (see PT Problem List). Pt will benefit from skilled PT to increase their independence and safety with mobility to allow discharge to the venue listed below.      Follow Up Recommendations SNF;Supervision/Assistance - 24 hour    Equipment Recommendations  None recommended by PT    Recommendations for Other Services       Precautions / Restrictions Precautions Precautions: Fall Restrictions Weight Bearing Restrictions: No      Mobility  Bed Mobility Overal bed mobility: Needs Assistance Bed Mobility: Rolling Rolling: Max assist;Total assist         General bed mobility comments: Pt resists movement therefore max to total assist to roll.    Transfers                 General transfer comment: Too difficult to roll with one person therefore did not sit pt up.   Ambulation/Gait                Stairs            Wheelchair Mobility    Modified Rankin (Stroke Patients Only)       Balance                                             Pertinent Vitals/Pain Pain Assessment: Faces Faces  Pain Scale: Hurts even more Pain Location: feet Pain Descriptors / Indicators: Grimacing;Guarding (neuropathy) Pain Intervention(s): Limited activity within patient's tolerance;Monitored during session;Repositioned    Home Living Family/patient expects to be discharged to:: Private residence Living Arrangements: Children;Other (Comment) (caregiver) Available Help at Discharge: Personal care attendant;Family;Available 24 hours/day (5 days week for 7-8 hours day) Type of Home: House Home Access: Ramped entrance     Home Layout: One level Home Equipment: Wheelchair - manual;Bedside commode;Tub bench;Walker - 4 wheels      Prior Function Level of Independence: Needs assistance   Gait / Transfers Assistance Needed: prior to 2 weeks ago used rollator and since fall, pivot to 3n1 anjd wheelchair with mod to max assist.  Could not follow directions and made it hard.   ADL's / Homemaking Assistance Needed: Bathing and dressing last 2 weeks total care and prior to fall was just set up.        Hand Dominance        Extremity/Trunk Assessment   Upper Extremity Assessment Upper Extremity Assessment: Generalized weakness    Lower Extremity Assessment Lower Extremity Assessment: RLE deficits/detail;LLE deficits/detail RLE Deficits / Details: grossly 3-/5 LLE Deficits /  Details: grossly 2/5       Communication   Communication: Expressive difficulties;Receptive difficulties;HOH (did not talk much)  Cognition Arousal/Alertness: Awake/alert Behavior During Therapy: Impulsive;Anxious Overall Cognitive Status: Impaired/Different from baseline Area of Impairment: Following commands;Safety/judgement;Orientation;Attention;Awareness;Problem solving                 Orientation Level: Disoriented to;Place;Time;Situation Current Attention Level: Focused   Following Commands: Follows one step commands inconsistently;Follows one step commands with increased time Safety/Judgement:  Decreased awareness of safety;Decreased awareness of deficits   Problem Solving: Slow processing;Decreased initiation;Difficulty sequencing;Requires verbal cues;Requires tactile cues        General Comments General comments (skin integrity, edema, etc.): 60 bpm, 100%RA, 19    Exercises General Exercises - Upper Extremity Shoulder Flexion: AROM;Both;10 reps;Supine Elbow Flexion: AROM;Both;10 reps;Supine General Exercises - Lower Extremity Ankle Circles/Pumps: AAROM;Both;5 reps;Supine Heel Slides: AAROM;Both;5 reps;Supine Hip ABduction/ADduction: AAROM;Both;5 reps;Supine   Assessment/Plan    PT Assessment Patient needs continued PT services  PT Problem List Decreased activity tolerance;Decreased balance;Decreased mobility;Decreased knowledge of use of DME;Decreased safety awareness;Decreased knowledge of precautions;Cardiopulmonary status limiting activity;Decreased skin integrity;Decreased strength;Decreased range of motion;Decreased coordination;Decreased cognition       PT Treatment Interventions DME instruction;Gait training;Functional mobility training;Therapeutic activities;Therapeutic exercise;Balance training;Cognitive remediation;Wheelchair mobility training;Patient/family education    PT Goals (Current goals can be found in the Care Plan section)  Acute Rehab PT Goals Patient Stated Goal: unable to state  PT Goal Formulation: With family Time For Goal Achievement: 11/19/19 Potential to Achieve Goals: Good    Frequency Min 2X/week   Barriers to discharge Decreased caregiver support (daughter cannot provide care any longer)      Co-evaluation               AM-PAC PT "6 Clicks" Mobility  Outcome Measure Help needed turning from your back to your side while in a flat bed without using bedrails?: Total Help needed moving from lying on your back to sitting on the side of a flat bed without using bedrails?: Total Help needed moving to and from a bed to a chair  (including a wheelchair)?: Total Help needed standing up from a chair using your arms (e.g., wheelchair or bedside chair)?: Total Help needed to walk in hospital room?: Total Help needed climbing 3-5 steps with a railing? : Total 6 Click Score: 6    End of Session Equipment Utilized During Treatment: Gait belt Activity Tolerance: Patient limited by fatigue Patient left: in bed;with call bell/phone within reach;with family/visitor present Nurse Communication: Mobility status;Need for lift equipment PT Visit Diagnosis: Unsteadiness on feet (R26.81);Muscle weakness (generalized) (M62.81);Pain;History of falling (Z91.81);Repeated falls (R29.6)    Time: 7793-9030 PT Time Calculation (min) (ACUTE ONLY): 21 min   Charges:   PT Evaluation $PT Eval Moderate Complexity: 1 Mod          Erin Fisher,PT Acute Rehabilitation Services Pager:  [phone]  Office:  [phone]    Erin Fisher 11/05/2019, 4:30 PM

## 2019-11-05 NOTE — Progress Notes (Signed)
TOC CM referral for SNF, waiting PT recommendations. Isidoro Donning RN CCM, [facility] ED TOC CM [phone]

## 2019-11-06 ENCOUNTER — Other Ambulatory Visit: Payer: Self-pay

## 2019-11-06 LAB — URINALYSIS, COMPLETE (UACMP) WITH MICROSCOPIC
Bacteria, UA: NONE SEEN
Bilirubin Urine: NEGATIVE
Glucose, UA: NEGATIVE mg/dL
Ketones, ur: NEGATIVE mg/dL
Leukocytes,Ua: NEGATIVE
Nitrite: NEGATIVE
Protein, ur: NEGATIVE mg/dL
Specific Gravity, Urine: 1.012 (ref 1.005–1.030)
pH: 6 (ref 5.0–8.0)

## 2019-11-06 LAB — RAPID URINE DRUG SCREEN, HOSP PERFORMED
Amphetamines: NOT DETECTED
Barbiturates: NOT DETECTED
Benzodiazepines: NOT DETECTED
Cocaine: NOT DETECTED
Opiates: NOT DETECTED
Tetrahydrocannabinol: NOT DETECTED

## 2019-11-06 NOTE — ED Notes (Signed)
Dinner Tray Ordered @ 1744. 

## 2019-11-06 NOTE — ED Notes (Signed)
Breakfast Ordered 

## 2019-11-06 NOTE — Discharge Planning (Signed)
RNCM met with pt and daughter at bedside regarding disposition.  RNCM advised of skilled nursing facilities that have accepted pt and daughter would like to add [facility] to list.    Pt daughter asked RNCM to warm pt breakfast aas it came "cold as ice."  RNCM warmed food in microwave and returned to room.  Pt daughter feeding pt as RNCM exited the room.

## 2019-11-06 NOTE — ED Notes (Signed)
Lunch Tray Ordered @ 1201. 

## 2019-11-06 NOTE — Progress Notes (Signed)
CSW faxed requested information to [facility] for PASRR review

## 2019-11-06 NOTE — ED Provider Notes (Signed)
Emergency Medicine Observation Re-evaluation Note  Erin Fisher is a 86 y.o. female, seen on rounds today.  Pt initially presented to the ED for complaints of Altered Mental Status  Patient arrived yesterday with chief complaint of altered mental status.  Patient is currently awaiting nursing facility placement.  Medically cleared by previous team.  Physical Exam  BP (!) 150/59   Pulse 62   Temp 98.1 F (36.7 C) (Oral)   Resp 16   Ht 5\' 3"  (1.6 m)   Wt 66.3 kg   SpO2 96%   BMI 25.89 kg/m  Physical Exam Constitutional:      General: She is not in acute distress.    Appearance: Normal appearance. She is well-developed. She is not ill-appearing or diaphoretic.  Eyes:     General: Vision grossly intact. Gaze aligned appropriately.  Neck:     Trachea: Trachea and phonation normal.  Pulmonary:     Effort: Pulmonary effort is normal. No respiratory distress.  Musculoskeletal:     Cervical back: Normal range of motion.  Skin:    General: Skin is warm and dry.  Neurological:     Mental Status: She is alert.     GCS: GCS eye subscore is 4. GCS verbal subscore is 5. GCS motor subscore is 6.     Comments: Alert, clear speech, answers basic questions appropriately.  Psychiatric:        Behavior: Behavior normal.     ED Course / MDM  EKG:EKG Interpretation  Date/Time:  Monday November 05 2019 11:11:45 EDT Ventricular Rate:  59 PR Interval:    QRS Duration: 91 QT Interval:  450 QTC Calculation: 446 R Axis:   -8 Text Interpretation: regular narrow qrs likely sinus rhythym Artifact T wave abnormality Abnormal ECG Confirmed by Carmin Muskrat [id number]) on 11/05/2019 12:39:35 PM    I have reviewed the labs performed to date as well as medications administered while in observation including any changes in the last 24 hours.  Lab review: UDS negative Urinalysis shows no evidence of infection Ethanol negative, patient does not appear intoxicated or in withdrawal CBG 96 PT/INR  within normal limits CBC shows anemia of 9.8, no leukocytosis to suggest infection CMP shows mild elevation of creatinine from prior, no emergent electrolyte derangement, gap or emergent elevation of LFTs. CXR MPRESSION:  Scarring right upper lobe. Lungs otherwise clear. Heart size normal.  No adenopathy. Previously noted hiatal hernia not well seen on this  examination.    Aortic Atherosclerosis (ICD10-I70.0).   CT Head:  IMPRESSION:  1. No acute intracranial abnormality.  2. 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  3. Resolved left scalp hematoma since last month.   Plan  Reviewed previous providers MDM, no indication for medical admission, patient awaiting up of social work team for placement as family can no longer care for patient at home.  Patient received fluid bolus for mild dehydration.  Her home meds were ordered.  Reviewed social work visit discharge planning from 11:09 AM this morning.  There trying to find a skilled nursing facility for the patient to go to. - I evaluated this patient, she is sitting upright in bed no acute distress vital signs stable on monitor.  Her family over at bedside is helping her eat breakfast.  Patient reports she is feeling well.  Family member has no current concerns.  Note: Portions of this report may have been transcribed using voice recognition software.  Every effort was made to ensure accuracy; however, inadvertent computerized transcription errors may still be present.   Bill Salinas, PA-C 11/06/19 1205    Tegeler, Canary Brim, MD 11/06/19 1547

## 2019-11-06 NOTE — TOC Progression Note (Signed)
Transition of Care [facility]) - Progression Note    Patient Details  Name: Erin Fisher MRN: [id number] Date of Birth: July 01, 1928  Transition of Care [facility]) CM/SW Contact  Marina Goodell Phone Number: [phone] 11/06/2019, 12:24 PM  Clinical Narrative:     CSW sent FL2 and referral to [facility] as per patient request.  Expected Discharge Plan: Skilled Nursing Facility Barriers to Discharge: Continued Medical Work up  Expected Discharge Plan and Services Expected Discharge Plan: Skilled Nursing Facility     Post Acute Care Choice: Skilled Nursing Facility Living arrangements for the past 2 months: Single Family Home                                       Social Determinants of Health (SDOH) Interventions    Readmission Risk Interventions No flowsheet data found.

## 2019-11-07 ENCOUNTER — Encounter (HOSPITAL_COMMUNITY): Payer: Self-pay

## 2019-11-07 NOTE — ED Notes (Signed)
Woke pt up to see if she was hungry. Pt stated, "I will get up in a few minutes". Will attempt to feed pt breakfast in a while.

## 2019-11-07 NOTE — ED Provider Notes (Signed)
Emergency Medicine Observation Re-evaluation Note  Erin Fisher is a 86 y.o. female, seen on rounds today.  Pt initially presented to the ED for complaints of Altered Mental Status Currently, the patient is resting comfortably in bed. EMT assisted patient with breakfast and she ate approximately half of everything. She has no complaints at this time. No family at the bedside during evaluation.  Physical Exam  BP (!) 170/43   Pulse 72   Temp 98.4 F (36.9 C) (Oral)   Resp 16   Ht 5\' 3"  (1.6 m)   Wt 66.3 kg   SpO2 100%   BMI 25.89 kg/m  Physical Exam  PE: Constitutional: well-developed, well-nourished, no apparent distress HENT: normocephalic, atraumatic.  Cardiovascular: normal rate and rhythm, distal pulses intact Pulmonary/Chest: effort normal; breath sounds clear and equal bilaterally; no wheezes or rales. Oxygen saturation is 100% on room air Abdominal: soft and nontender Musculoskeletal: no edema Neurological: alert to self and location. She answers simple questions. GCS 15. Skin: warm and dry, no rash, no diaphoresis. No breakdown Psychiatric: normal mood and affect   ED Course / MDM  EKG:EKG Interpretation  Date/Time:  Monday November 05 2019 11:11:45 EDT Ventricular Rate:  59 PR Interval:    QRS Duration: 91 QT Interval:  450 QTC Calculation: 446 R Axis:   -8 Text Interpretation: regular narrow qrs likely sinus rhythym Artifact T wave abnormality Abnormal ECG Confirmed by 08-11-1993 [phone]) on 11/05/2019 12:39:35 PM    I have reviewed the labs performed to date as well as medications administered while in observation.  Recent changes in the last 24 hours include no major events. Plan  Current plan is for SNF placement. CSW sent FL2 and referral to 11/07/2019 Rehab per patient and family request. Still pending placement at this time. She has purewick in place with normal amount of yellow urine in bedside canister. She is currently receiving IVF. RN to follow  up with CSW for updates.     Portions of this note were generated with [facility]. Dictation errors may occur despite best attempts at proofreading.     Scientist, clinical (histocompatibility and immunogenetics), PA-C 11/07/19 1222    11/09/19, MD 11/08/19 1012

## 2019-11-07 NOTE — ED Notes (Signed)
Pt's breakfast arrived 

## 2019-11-07 NOTE — ED Notes (Signed)
Dinner ordered 

## 2019-11-07 NOTE — Progress Notes (Addendum)
CSW faxed PASRR # to [facility] @ [phone]  CSW also spoke to Pts daughter, Melina Schools, to update about PASRR and to make clear that PASRR was only for the 30 rehabilitation stay. Daughter was at [facility] at the time of phone call.

## 2019-11-07 NOTE — ED Notes (Signed)
Breakfast ordered 

## 2019-11-07 NOTE — Progress Notes (Signed)
CSW received notice that Pt was issued a PASRR-E # for a 30-day rehabilitation.  [id number]

## 2019-11-07 NOTE — TOC Progression Note (Signed)
Transition of Care [facility]) - Progression Note    Patient Details  Name: Erin Fisher MRN: [id number] Date of Birth: 1928-08-08  Transition of Care [facility]) CM/SW Contact  Marina Goodell Phone Number: [phone] 11/07/2019, 12:42 PM  Clinical Narrative:     Patietn has bed offer at [facility], CSW started insurance authorization for placement REF# [id number], faxed clinicals, facesheet, med list, labs and evals to [phone].  CSW called updated Melina Schools [phone] and left voicemail for her to call me back.  Expected Discharge Plan: Skilled Nursing Facility Barriers to Discharge: Continued Medical Work up  Expected Discharge Plan and Services Expected Discharge Plan: Skilled Nursing Facility     Post Acute Care Choice: Skilled Nursing Facility Living arrangements for the past 2 months: Single Family Home                                       Social Determinants of Health (SDOH) Interventions    Readmission Risk Interventions No flowsheet data found.

## 2019-11-07 NOTE — ED Notes (Signed)
Pt alert and answers questions but refuses to eat at this time

## 2019-11-07 NOTE — ED Notes (Signed)
Assisted pt with breakfast. Pt ate the entire container of applesauce, a few bites of grits, bacon, and french toast.

## 2019-11-07 NOTE — ED Notes (Signed)
Patient is resting comfortably. Pt had a good lunch & wanted to call her daughter around 4pm. We attempted to call her daughter and she told her to "give her a call back when she got off work around 5pm." I told Ms.Buley that, "we would give her daughter a call back in a little while." Ms. Lowder was very concerned about her personal belongings as far as her pocketbook, checkbook, and wallet. She believes that her daughter is out to get her. She's unsure about what she needs to do to be able to get her personal belongings back in her possession.

## 2019-11-08 NOTE — Progress Notes (Signed)
TOC CM received call from Ammie Ferrier #[phone] from [facility]. Wanted to follow up on patient. Returned call and left HIPAA compliant message for return call.   Isidoro Donning RN CCM, [facility] ED TOC CM [phone]

## 2019-11-08 NOTE — Progress Notes (Signed)
Physical Therapy Treatment Patient Details Name: Erin Fisher MRN: [id number] DOB: 1928/06/17 Today's Date: 11/08/2019    History of Present Illness Patient presents via EMS from home.  Family reports a fall 2 weeks ago, following which she was seen at a different emergency department and sent home.  Seemingly the patient has had weakness since about that time worsening over the past 2 weeks.  Home health nurse also report possible left-sided facial droop, and weakness, possibly yesterday.     PT Comments    Pt progressing towards goals and able to tolerate increased mobility. Remains limited secondary to pain. Was able to sit at EOB with max A this session. Assisted with performing BLE HEP. Pt very sensitive to touch in BLE. Current recommendations appropriate as pt was previously at a supervision level with rollator 2 weeks ago. Will continue to follow acutely.     Follow Up Recommendations  SNF;Supervision/Assistance - 24 hour     Equipment Recommendations  None recommended by PT    Recommendations for Other Services       Precautions / Restrictions Precautions Precautions: Fall Restrictions Weight Bearing Restrictions: No    Mobility  Bed Mobility Overal bed mobility: Needs Assistance Bed Mobility: Supine to Sit;Sit to Supine     Supine to sit: Max assist Sit to supine: Max assist   General bed mobility comments: Max A for LE assist and trunk elevation. Pt reporting increased pain in BLE. Was able to perform some seated HEP.   Transfers                    Ambulation/Gait                 Stairs             Wheelchair Mobility    Modified Rankin (Stroke Patients Only)       Balance Overall balance assessment: Needs assistance Sitting-balance support: No upper extremity supported;Feet supported Sitting balance-Leahy Scale: Fair                                      Cognition Arousal/Alertness:  Awake/alert Behavior During Therapy: WFL for tasks assessed/performed Overall Cognitive Status: Impaired/Different from baseline Area of Impairment: Following commands;Safety/judgement;Attention;Awareness;Problem solving;Memory                   Current Attention Level: Sustained Memory: Decreased recall of precautions;Decreased short-term memory Following Commands: Follows one step commands with increased time Safety/Judgement: Decreased awareness of safety;Decreased awareness of deficits Awareness: Intellectual Problem Solving: Slow processing;Decreased initiation;Difficulty sequencing;Requires verbal cues;Requires tactile cues General Comments: Pt with improved cognition from previous session and was able to follow some commands with increased time.       Exercises General Exercises - Lower Extremity Ankle Circles/Pumps: AROM;Both;10 reps;Supine Long Arc Quad: AROM;Both;5 reps;Seated Heel Slides: AAROM;Both;5 reps;Supine    General Comments        Pertinent Vitals/Pain Pain Assessment: Faces Faces Pain Scale: Hurts even more Pain Location: BLE and feet (LLE>RLE) Pain Descriptors / Indicators: Guarding;Grimacing Pain Intervention(s): Monitored during session;Limited activity within patient's tolerance;Repositioned    Home Living                      Prior Function            PT Goals (current goals can now be found in the care plan section) Acute Rehab PT  Goals Patient Stated Goal: unable to state  PT Goal Formulation: With patient Time For Goal Achievement: 11/19/19 Potential to Achieve Goals: Good Progress towards PT goals: Progressing toward goals    Frequency    Min 2X/week      PT Plan Current plan remains appropriate    Co-evaluation              AM-PAC PT "6 Clicks" Mobility   Outcome Measure  Help needed turning from your back to your side while in a flat bed without using bedrails?: A Lot Help needed moving from lying on  your back to sitting on the side of a flat bed without using bedrails?: Total Help needed moving to and from a bed to a chair (including a wheelchair)?: Total Help needed standing up from a chair using your arms (e.g., wheelchair or bedside chair)?: Total Help needed to walk in hospital room?: Total Help needed climbing 3-5 steps with a railing? : Total 6 Click Score: 7    End of Session Equipment Utilized During Treatment: Gait belt Activity Tolerance: Patient limited by pain Patient left: in bed;with call bell/phone within reach Nurse Communication: Mobility status PT Visit Diagnosis: Unsteadiness on feet (R26.81);Muscle weakness (generalized) (M62.81);Pain;History of falling (Z91.81);Repeated falls (R29.6) Pain - Right/Left:  (bilateral ) Pain - part of body: Leg     Time: 1432-1500 PT Time Calculation (min) (ACUTE ONLY): 28 min  Charges:  $Therapeutic Activity: 23-37 mins                     Cindee Salt, DPT  Acute Rehabilitation Services  Pager: [phone] Office: [phone]    Erin Fisher 11/08/2019, 5:34 PM

## 2019-11-08 NOTE — ED Notes (Signed)
Breakfast ordered 

## 2019-11-08 NOTE — TOC Progression Note (Addendum)
Transition of Care [facility]) - Progression Note    Patient Details  Name: Erin Fisher MRN: [id number] Date of Birth: 09-Jul-1928  Transition of Care [facility]) CM/SW Contact  Marina Goodell Phone Number: [phone] 11/08/2019, 3:13 PM  Clinical Narrative:   Insurance has agreed to appeal valid for the next 24 hours [phone] REF# [id number]. This CSW will fax PT/OT recommendations and psych evaluation, [phone].  This CSW spoke with the patient's daughter Ms. White to update her on appeals process.  This CSW did tell Ms. White if the appeal is denied through the patient will need to go home.  Ms. Cliffton Asters verbalized understanding.     Expected Discharge Plan: Skilled Nursing Facility Barriers to Discharge: Continued Medical Work up  Expected Discharge Plan and Services Expected Discharge Plan: Skilled Nursing Facility     Post Acute Care Choice: Skilled Nursing Facility Living arrangements for the past 2 months: Single Family Home                                       Social Determinants of Health (SDOH) Interventions    Readmission Risk Interventions No flowsheet data found.

## 2019-11-08 NOTE — ED Notes (Signed)
Pt moved onto hospital bed

## 2019-11-08 NOTE — ED Provider Notes (Signed)
Emergency Medicine Observation Re-evaluation Note  Erin Fisher is a 86 y.o. female, seen on rounds today.  Pt initially presented to the ED for complaints of Altered Mental Status Currently, the patient is resting in bed in no acute distress, awaiting placement by SW.  Physical Exam  BP (!) 134/57   Pulse (!) 58   Temp 98 F (36.7 C) (Oral)   Resp 18   Ht 5\' 3"  (1.6 m)   Wt 66.3 kg   SpO2 96%   BMI 25.89 kg/m  Physical Exam Vitals and nursing note reviewed.  Constitutional:      Appearance: Normal appearance.  HENT:     Head: Normocephalic.     Nose: Nose normal.     Mouth/Throat:     Mouth: Mucous membranes are moist.  Eyes:     Extraocular Movements: Extraocular movements intact.     Pupils: Pupils are equal, round, and reactive to light.  Cardiovascular:     Rate and Rhythm: Normal rate.     Pulses: Normal pulses.  Pulmonary:     Effort: Pulmonary effort is normal. No respiratory distress.  Abdominal:     General: Abdomen is flat.     Tenderness: There is no abdominal tenderness.  Musculoskeletal:        General: No deformity or signs of injury.     Cervical back: Normal range of motion.  Skin:    General: Skin is warm.     Capillary Refill: Capillary refill takes less than 2 seconds.  Neurological:     Mental Status: She is alert.     ED Course / MDM  EKG:EKG Interpretation  Date/Time:  Monday November 05 2019 11:11:45 EDT Ventricular Rate:  59 PR Interval:    QRS Duration: 91 QT Interval:  450 QTC Calculation: 446 R Axis:   -8 Text Interpretation: regular narrow qrs likely sinus rhythym Artifact T wave abnormality Abnormal ECG Confirmed by 08-11-1993 [phone]) on 11/05/2019 12:39:35 PM    I have reviewed the labs performed to date as well as medications administered while in observation.  Recent changes in the last 24 hours include SW said [facility] requested Peer to Peer.  [facility]. [facility] with social work reviewed case with me.  Concern [employer] will  decline, states pt daughter is aware and is willing to take patient home if this is the case. [employer] RN has declined case, they feel patient is at baseline functioning status and does not qualify for SNF/IPR placement. States patient may benefit from long term care if patient's family is no longer able to care for patient like they have in the past.  I completed a peer to peer review with Dr. Renee Pain.  I expressed the concern that patient has had a decline from her baseline based on the report from daughter and discussion with social work, review of PT notes.  Dr. Maye Hides reports that they have well-documented in their system that patient has known history of requiring extensive assistance that has previously been provided by adult family members.  He states that Maye Hides is declining and their is no further option for Francine Graven to appeal case. He recommends that family pursue Long Term care options with alternate payer.   Plan   Will update CM/SW Ensure home health set up  Will discharge home later today, will need transport      [place], MD 11/08/19 [phone]

## 2019-11-08 NOTE — ED Notes (Signed)
RN assisted pt with eating dinner.

## 2019-11-08 NOTE — TOC Progression Note (Signed)
Transition of Care [facility]) - Progression Note    Patient Details  Name: Deanna Wiater MRN: [id number] Date of Birth: [birth year]  Transition of Care [facility]) CM/SW Contact  Marina Goodell Phone Number: [phone] 11/08/2019, 10:25 AM  Clinical Narrative:     CSW spoke with EDP patient has been denied insurance authorization for SNF placement.  This CSW spoke with the patient's daughter Melina Schools with an update status. Ms. Cliffton Asters stated her disappointment and was concerned about caring for the patient at home.  Ms. Cliffton Asters stated "my mom [the patient] doesn't have 24 hour care.  She had someone who would take care of her but we can't afford that anymore."  This CSW informed Ms. White she would be able appeal but she would need to contact the insurance directly.  This CSW also recommended the patient contact the SNF placement to assist with the appeal. Ms. Cliffton Asters stated her understanding.  This CSW informed if the appeal did not go through the patient would need to return home with home health.  Ms. Cliffton Asters verbalized understanding.  Expected Discharge Plan: Skilled Nursing Facility Barriers to Discharge: Continued Medical Work up  Expected Discharge Plan and Services Expected Discharge Plan: Skilled Nursing Facility     Post Acute Care Choice: Skilled Nursing Facility Living arrangements for the past 2 months: Single Family Home                                       Social Determinants of Health (SDOH) Interventions    Readmission Risk Interventions No flowsheet data found.

## 2019-11-09 MED ORDER — BACITRACIN ZINC 500 UNIT/GM EX OINT
TOPICAL_OINTMENT | Freq: Two times a day (BID) | CUTANEOUS | Status: DC
  Administered 2019-11-09 – 2019-11-11 (×4): 1 via TOPICAL
  Filled 2019-11-09 (×4): qty 0.9

## 2019-11-09 NOTE — TOC Progression Note (Signed)
Transition of Care [facility]) - Progression Note    Patient Details  Name: Erin Fisher MRN: [id number] Date of Birth: August 15, 1928  Transition of Care [facility]) CM/SW Contact  Joseph Art, [place] Phone Number: 11/09/2019, 11:31 AM  Clinical Narrative:     Clinicals and evals sent to [facility] for appeal [phone], fax [phone], phone number [phone], ext #[phone].  Expected Discharge Plan: Skilled Nursing Facility Barriers to Discharge: Continued Medical Work up  Expected Discharge Plan and Services Expected Discharge Plan: Skilled Nursing Facility     Post Acute Care Choice: Skilled Nursing Facility Living arrangements for the past 2 months: Single Family Home                                       Social Determinants of Health (SDOH) Interventions    Readmission Risk Interventions No flowsheet data found.

## 2019-11-09 NOTE — ED Notes (Signed)
Pt still eating her salad

## 2019-11-09 NOTE — ED Notes (Signed)
Dinner ordered 

## 2019-11-09 NOTE — ED Notes (Signed)
Lunch Tray Ordered @ 1049. °

## 2019-11-09 NOTE — ED Notes (Signed)
Breakfast Ordered 

## 2019-11-09 NOTE — ED Provider Notes (Signed)
86 year old female presented to emergency room with altered mental status, is currently awaiting skilled nursing facility placement.  Review of charts, patient's insurance originally denied skilled nursing facility placement however is currently in appeal process, if unable to appeal this decision may need to seek long-term care versus discharge to home and family care. Physical Exam  BP (!) 136/53 (BP Location: Right Arm)   Pulse 63   Temp 97.8 F (36.6 C)   Resp 18   Ht 5\' 3"  (1.6 m)   Wt 66.3 kg   SpO2 98%   BMI 25.89 kg/m   Physical Exam Vitals and nursing note reviewed.  Constitutional:      Comments: Frail, chronically ill-appearing.  HENT:     Head: Normocephalic and atraumatic.     Nose: Nose normal.     Mouth/Throat:     Mouth: Mucous membranes are moist.  Eyes:     Extraocular Movements: Extraocular movements intact.     Pupils: Pupils are equal, round, and reactive to light.  Cardiovascular:     Rate and Rhythm: Normal rate and regular rhythm.     Pulses: Normal pulses.     Heart sounds: Normal heart sounds.  Pulmonary:     Effort: Pulmonary effort is normal.     Breath sounds: Normal breath sounds.  Abdominal:     Palpations: Abdomen is soft.     Tenderness: There is no abdominal tenderness.  Musculoskeletal:        General: No tenderness.     Cervical back: Neck supple.     Right lower leg: No edema.     Left lower leg: No edema.  Skin:    General: Skin is warm and dry.     Findings: Bruising and erythema present.     Comments: Small area of erythema to mid sternum, mildly tender, nonfluctuant.  Mild bruising to lower extremities without bony tenderness or pain with range of motion.  Neurological:     Mental Status: She is alert.     Comments: Patient is hard of hearing, is alert to her name and date of birth.     ED Course/Procedures     Procedures  MDM  Bacitracin ordered for small area of erythema to the mid sternum. Pneumatic compression  ordered for DVT prophylaxis. Patient is awaiting SNF placement/appeal process.       , PA-C 11/09/19 1449    11/11/19, MD 11/10/19 [phone]

## 2019-11-10 ENCOUNTER — Telehealth: Payer: Self-pay

## 2019-11-10 NOTE — Progress Notes (Signed)
CSW spoke with patient's daughter about discharge with home health. CSW acknowledges daughter's concerns with mother returning home. CSW discussed setting up home health to assist at home and notes daughter reports they a plan for a second review with [facility] Boer for assisted living on Monday. CSW will review with RNCM.

## 2019-11-10 NOTE — ED Notes (Signed)
Help get patient pull up in bd cleaned up and placed a new external cath patient is resting with call bell in reach

## 2019-11-10 NOTE — ED Notes (Signed)
Patient repositioned on bed and linens changed, breakfast tray given. Patient able to feed herself.

## 2019-11-10 NOTE — ED Notes (Signed)
Help get patient cleaned patient is resting with call bell in reach

## 2019-11-10 NOTE — ED Notes (Signed)
Pt feeding herself.  

## 2019-11-10 NOTE — ED Notes (Signed)
Pt watching tv

## 2019-11-10 NOTE — ED Notes (Signed)
Pt with dressing to left hand removed at this time. Skin tear present to posterior hand. Redressed with xeroform, nonadherent dressing, and coban. No other injuries noted.

## 2019-11-10 NOTE — ED Notes (Signed)
2 staff assisted pt to chair.  Bedding changed after pt ate lunch.  Attempted to ambulate pt.  Pt was able to take several steps with walker, but kept complaining that she had broken her ankles and they hurt.  Did not note any ankle x-rays in hx.  Will notify pa.

## 2019-11-10 NOTE — Progress Notes (Signed)
Physical Therapy Treatment Patient Details Name: Erin Fisher MRN: [id number] DOB: May 20, 1929 Today's Date: 11/10/2019    History of Present Illness Patient presents via EMS from home.  Family reports a fall 2 weeks ago, following which she was seen at a different emergency department and sent home.  Seemingly the patient has had weakness since about that time worsening over the past 2 weeks.  Home health nurse also report possible left-sided facial droop, and weakness, possibly yesterday.     PT Comments    Asked to see pt as denied SNF.  Patient still requires increased assistance for all mobility.  Today required +2 assist to stand at edge of bed, but unable to reach full standing - leaning over RW and unable to move feet.  Patient limited by confusion which makes it difficult to instruct her and get her to participate.  Continue to recommend SNF.     Follow Up Recommendations  SNF;Supervision/Assistance - 24 hour     Equipment Recommendations  None recommended by PT    Recommendations for Other Services       Precautions / Restrictions Precautions Precautions: Fall    Mobility  Bed Mobility Overal bed mobility: Needs Assistance Bed Mobility: Supine to Sit;Sit to Supine     Supine to sit: Max assist Sit to supine: Max assist;+2 for physical assistance   General bed mobility comments: Max A for LE assist and trunk elevation.  Transfers Overall transfer level: Needs assistance Equipment used: Rolling walker (2 wheeled) Transfers: Sit to/from Stand Sit to Stand: Max assist;+2 physical assistance         General transfer comment: pt unable to reach full upright; leaning over RW; unable to march in place  Ambulation/Gait Ambulation/Gait assistance:  (not attempted)               Optometrist    Modified Rankin (Stroke Patients Only)       Balance Overall balance assessment: Needs assistance Sitting-balance  support: No upper extremity supported;Feet supported Sitting balance-Leahy Scale: Fair     Standing balance support: Bilateral upper extremity supported;During functional activity Standing balance-Leahy Scale: Zero                              Cognition Arousal/Alertness: Awake/alert Behavior During Therapy: WFL for tasks assessed/performed Overall Cognitive Status: Impaired/Different from baseline Area of Impairment: Following commands;Safety/judgement;Attention;Awareness;Problem solving;Memory                 Orientation Level: Disoriented to;Place;Time;Situation Current Attention Level: Sustained Memory: Decreased recall of precautions;Decreased short-term memory Following Commands: Follows one step commands with increased time Safety/Judgement: Decreased awareness of safety;Decreased awareness of deficits Awareness: Intellectual Problem Solving: Slow processing;Decreased initiation;Difficulty sequencing;Requires verbal cues;Requires tactile cues General Comments: pt discussing walking to [facility] and stating her legs are broken; confusion appears worse today      Exercises      General Comments        Pertinent Vitals/Pain Pain Assessment: Faces Faces Pain Scale: Hurts a little bit Pain Location:  (pt stated her LE are broken, but able to move them without pain) Pain Intervention(s): Monitored during session    Home Living                      Prior Function  PT Goals (current goals can now be found in the care plan section) Progress towards PT goals: Progressing toward goals    Frequency    Min 2X/week      PT Plan Current plan remains appropriate    Co-evaluation              AM-PAC PT "6 Clicks" Mobility   Outcome Measure  Help needed turning from your back to your side while in a flat bed without using bedrails?: A Lot Help needed moving from lying on your back to sitting on the side of a flat bed  without using bedrails?: A Lot Help needed moving to and from a bed to a chair (including a wheelchair)?: Total Help needed standing up from a chair using your arms (e.g., wheelchair or bedside chair)?: Total Help needed to walk in hospital room?: Total Help needed climbing 3-5 steps with a railing? : Total 6 Click Score: 8    End of Session Equipment Utilized During Treatment: Gait belt Activity Tolerance: Patient tolerated treatment well Patient left: in bed;with call bell/phone within reach;with bed alarm set Nurse Communication: Mobility status PT Visit Diagnosis: Unsteadiness on feet (R26.81);Muscle weakness (generalized) (M62.81);Pain;History of falling (Z91.81);Repeated falls (R29.6)     Time: 1275-1700 PT Time Calculation (min) (ACUTE ONLY): 28 min  Charges:  $Therapeutic Activity: 23-37 mins                     11/10/2019 Margie, PT Acute Rehabilitation Services Pager:  [phone] Office:  [phone]     Shanna Cisco 11/10/2019, 4:29 PM

## 2019-11-10 NOTE — ED Notes (Signed)
Pt confused oriented to self. Pt continues to state "we are trying to get out of here."  Pt attempted to exit bed. Bed alarm on hospital bed on. Pt visible of nurses station.

## 2019-11-10 NOTE — Telephone Encounter (Signed)
Called daughter about setting up home health and any DME the patient may need. No answer message left to call back

## 2019-11-10 NOTE — ED Provider Notes (Addendum)
  Physical Exam  BP (!) 122/57 (BP Location: Left Arm)   Pulse 70   Temp 98.1 F (36.7 C) (Oral)   Resp 18   Ht 5\' 3"  (1.6 m)   Wt 66.3 kg   SpO2 98%   BMI 25.89 kg/m   Physical Exam Vitals and nursing note reviewed.  Constitutional:      General: She is not in acute distress.    Appearance: She is well-developed. She is not diaphoretic.  HENT:     Head: Normocephalic and atraumatic.  Eyes:     General: No scleral icterus.    Conjunctiva/sclera: Conjunctivae normal.  Pulmonary:     Effort: Pulmonary effort is normal. No respiratory distress.  Musculoskeletal:     Cervical back: Normal range of motion.  Skin:    Findings: No rash.  Neurological:     Mental Status: She is alert.     ED Course/Procedures     Procedures  MDM  86 year old female presenting to the ED for altered mental status, is currently awaiting skilled nursing facility placement.  Initially she did not meet criteria for SNF placement.  She is currently appealing this decision.  If the appeal is not overturned, she will need to be discharged versus placed in a long-term care facility.  On my exam patient is resting comfortably, stating that she is concerned that her children are fighting over her money.  She is concerned that they are not giving her the property that she has.  She has no other requests for me.  Will await potential placement.  1:42 PM Spoke to 92 who got in touch with [employer], they are unable to appeal their prior decision.  They are requesting we place home health face-to-face consult for potential home health needs as she will likely be discharged back to her home.  Social worker will get in touch with [facility].   Portions of this note were generated with [facility]. Dictation errors may occur despite best attempts at proofreading.     Scientist, clinical (histocompatibility and immunogenetics), PA-C 11/10/19 1343    11/12/19, MD 11/14/19 [phone]

## 2019-11-10 NOTE — ED Notes (Signed)
The pt has hardly slept all night maybe 2 hours  shes confused and hard of hearing

## 2019-11-10 NOTE — ED Notes (Signed)
Breakfast Ordered 

## 2019-11-11 ENCOUNTER — Emergency Department (HOSPITAL_COMMUNITY): Payer: Medicare HMO

## 2019-11-11 ENCOUNTER — Telehealth: Payer: Self-pay

## 2019-11-11 LAB — URINALYSIS, ROUTINE W REFLEX MICROSCOPIC
Bilirubin Urine: NEGATIVE
Glucose, UA: NEGATIVE mg/dL
Hgb urine dipstick: NEGATIVE
Ketones, ur: NEGATIVE mg/dL
Nitrite: NEGATIVE
Protein, ur: 30 mg/dL — AB
Specific Gravity, Urine: 1.019 (ref 1.005–1.030)
pH: 6 (ref 5.0–8.0)

## 2019-11-11 LAB — CBC WITH DIFFERENTIAL/PLATELET
Abs Immature Granulocytes: 0.03 10*3/uL (ref 0.00–0.07)
Basophils Absolute: 0.1 10*3/uL (ref 0.0–0.1)
Basophils Relative: 1 %
Eosinophils Absolute: 0.2 10*3/uL (ref 0.0–0.5)
Eosinophils Relative: 2 %
HCT: 30.7 % — ABNORMAL LOW (ref 36.0–46.0)
Hemoglobin: 9.6 g/dL — ABNORMAL LOW (ref 12.0–15.0)
Immature Granulocytes: 1 %
Lymphocytes Relative: 35 %
Lymphs Abs: 2.3 10*3/uL (ref 0.7–4.0)
MCH: 30.6 pg (ref 26.0–34.0)
MCHC: 31.3 g/dL (ref 30.0–36.0)
MCV: 97.8 fL (ref 80.0–100.0)
Monocytes Absolute: 0.7 10*3/uL (ref 0.1–1.0)
Monocytes Relative: 10 %
Neutro Abs: 3.4 10*3/uL (ref 1.7–7.7)
Neutrophils Relative %: 51 %
Platelets: 233 10*3/uL (ref 150–400)
RBC: 3.14 MIL/uL — ABNORMAL LOW (ref 3.87–5.11)
RDW: 18.9 % — ABNORMAL HIGH (ref 11.5–15.5)
WBC: 6.6 10*3/uL (ref 4.0–10.5)
nRBC: 0 % (ref 0.0–0.2)

## 2019-11-11 LAB — COMPREHENSIVE METABOLIC PANEL
ALT: 18 U/L (ref 0–44)
AST: 37 U/L (ref 15–41)
Albumin: 2.5 g/dL — ABNORMAL LOW (ref 3.5–5.0)
Alkaline Phosphatase: 71 U/L (ref 38–126)
Anion gap: 8 (ref 5–15)
BUN: 20 mg/dL (ref 8–23)
CO2: 23 mmol/L (ref 22–32)
Calcium: 8.7 mg/dL — ABNORMAL LOW (ref 8.9–10.3)
Chloride: 109 mmol/L (ref 98–111)
Creatinine, Ser: 1.57 mg/dL — ABNORMAL HIGH (ref 0.44–1.00)
GFR calc Af Amer: 34 mL/min — ABNORMAL LOW (ref >60)
GFR calc non Af Amer: 29 mL/min — ABNORMAL LOW (ref >60)
Glucose, Bld: 101 mg/dL — ABNORMAL HIGH (ref 70–99)
Potassium: 4.3 mmol/L (ref 3.5–5.1)
Sodium: 140 mmol/L (ref 135–145)
Total Bilirubin: 0.6 mg/dL (ref 0.3–1.2)
Total Protein: 6.9 g/dL (ref 6.5–8.1)

## 2019-11-11 LAB — CBG MONITORING, ED: Glucose-Capillary: 90 mg/dL (ref 70–99)

## 2019-11-11 LAB — TSH: TSH: 3.611 u[IU]/mL (ref 0.350–4.500)

## 2019-11-11 NOTE — ED Notes (Signed)
Pt's lunch arrived 

## 2019-11-11 NOTE — ED Notes (Signed)
Lunch ordered 

## 2019-11-11 NOTE — ED Notes (Signed)
Pt assisted with feeding breakfast tray. Pt repeatedly saying "no more." Encouraged bites of tray. Ate 10% of breakfast. Pt a&ox1.

## 2019-11-11 NOTE — ED Notes (Signed)
Pt diet changed to soft diet d/t not having all dentition. Pt had difficulty eating regular breakfast diet. Pt assisted with feeding applesauce.

## 2019-11-11 NOTE — Telephone Encounter (Signed)
Erin Fisher called back. If she takes her mother home she will need home health PT Social work, Engineer, production. She already has walker at home, no DME needed at this time. Will need follow up conversation to make sure she does have everything.

## 2019-11-11 NOTE — ED Notes (Signed)
Pt fed. Seems to do well with thickened liquids and mashed food. Does not have a good appetite . Must be encouraged to eat. Drank all liquids and a several bites of food.

## 2019-11-11 NOTE — ED Notes (Signed)
Patient exhibited confusion with vocalized disorientation to surroundings and then attempted to climb out of bed. Client repositioned in bed and additional warm blankets provided. Patient is now sleeping.

## 2019-11-11 NOTE — ED Notes (Signed)
Pt fed a cup of applesauce.

## 2019-11-11 NOTE — ED Notes (Signed)
Regular breakfast tray ordered.  

## 2019-11-11 NOTE — ED Provider Notes (Signed)
Physical Exam  BP 125/60 (BP Location: Right Arm)   Pulse 65   Temp 97.8 F (36.6 C) (Oral)   Resp 16   Ht  (1.6 m)   Wt 66.3 kg   SpO2 95%   BMI 25.89 kg/m   Physical Exam Vitals and nursing note reviewed.  Constitutional:      General: She is not in acute distress.    Appearance: She is well-developed. She is not diaphoretic.  HENT:     Head: Normocephalic and atraumatic.     Mouth/Throat:     Mouth: Mucous membranes are moist.     Pharynx: Oropharynx is clear.  Eyes:     Conjunctiva/sclera: Conjunctivae normal.  Cardiovascular:     Rate and Rhythm: Normal rate and regular rhythm.     Pulses: Normal pulses.          Radial pulses are 2+ on the right side and 2+ on the left side.       Posterior tibial pulses are 2+ on the right side and 2+ on the left side.     Heart sounds: Normal heart sounds.     Comments: Tactile temperature in the extremities appropriate and equal bilaterally. Pulmonary:     Effort: Pulmonary effort is normal. No respiratory distress.     Breath sounds: Normal breath sounds.  Abdominal:     Palpations: Abdomen is soft.     Tenderness: There is no abdominal tenderness. There is no guarding.  Musculoskeletal:     Cervical back: Neck supple.     Right lower leg: No edema.     Left lower leg: No edema.  Lymphadenopathy:     Cervical: No cervical adenopathy.  Skin:    General: Skin is warm and dry.  Neurological:     Mental Status: She is alert.     Comments: Patient will open her eyes to voice.  She will not directly answer any questions.  She will say things like, "Are you happy with yourself?" When asked if she is okay, she simply shakes her head. She cannot or will not follow commands to move extremities or perform other testing, such as smiling or testing of EOMs.  Psychiatric:        Mood and Affect: Mood and affect normal.        Speech: Speech normal.        Behavior: Behavior normal.    ED Course/Procedures      Procedures   Abnormal Labs Reviewed  COMPREHENSIVE METABOLIC PANEL - Abnormal; Notable for the following components:      Result Value   CO2 21 (*)    Glucose, Bld 107 (*)    BUN 24 (*)    Creatinine, Ser 1.57 (*)    Albumin 2.7 (*)    GFR calc non Af Amer 29 (*)    GFR calc Af Amer 34 (*)    All other components within normal limits  CBC WITH DIFFERENTIAL/PLATELET - Abnormal; Notable for the following components:   RBC 3.23 (*)    Hemoglobin 9.8 (*)    HCT 31.2 (*)    RDW 19.0 (*)    All other components within normal limits  URINALYSIS, COMPLETE (UACMP) WITH MICROSCOPIC - Abnormal; Notable for the following components:   Hgb urine dipstick SMALL (*)    All other components within normal limits  COMPREHENSIVE METABOLIC PANEL - Abnormal; Notable for the following components:   Glucose, Bld 101 (*)  Creatinine, Ser 1.57 (*)    Calcium 8.7 (*)    Albumin 2.5 (*)    GFR calc non Af Amer 29 (*)    GFR calc Af Amer 34 (*)    All other components within normal limits  CBC WITH DIFFERENTIAL/PLATELET - Abnormal; Notable for the following components:   RBC 3.14 (*)    Hemoglobin 9.6 (*)    HCT 30.7 (*)    RDW 18.9 (*)    All other components within normal limits  URINALYSIS, ROUTINE W REFLEX MICROSCOPIC - Abnormal; Notable for the following components:   Color, Urine AMBER (*)    APPearance HAZY (*)    Protein, ur 30 (*)    Leukocytes,Ua TRACE (*)    Bacteria, UA RARE (*)    All other components within normal limits      EKG Interpretation  Date/Time:  Monday November 05 2019 11:11:45 EDT Ventricular Rate:  59 PR Interval:    QRS Duration: 91 QT Interval:  450 QTC Calculation: 446 R Axis:   -8 Text Interpretation: regular narrow qrs likely sinus rhythym Artifact T wave abnormality Abnormal ECG Confirmed by Gerhard Munch [phone]) on 11/05/2019 12:39:35 PM        EKG Interpretation  Date/Time:  Sunday November 11 2019 18:38:08 EDT Ventricular Rate:  69 PR  Interval:    QRS Duration: 85 QT Interval:  424 QTC Calculation: 455 R Axis:   -5 Text Interpretation: Sinus rhythm Confirmed by Kristine Royal [phone]) on 11/11/2019 6:52:26 PM       CT Head Wo Contrast  Result Date: 11/11/2019 CLINICAL DATA:  86 year old female with altered mental status. EXAM: CT HEAD WITHOUT CONTRAST TECHNIQUE: Contiguous axial images were obtained from the base of the skull through the vertex without intravenous contrast. COMPARISON:  Head CT dated 06/07/2019. FINDINGS: Brain: There is mild age-related atrophy and chronic microvascular ischemic changes. Areas of old infarct and encephalomalacia involving the right posterior temporal and parietal region as well as left occipital lobe and left cerebellar hemisphere. There is no acute intracranial hemorrhage. No mass effect or midline shift. No extra-axial fluid collection. Vascular: No hyperdense vessel or unexpected calcification. Skull: Normal. Negative for fracture or focal lesion. Sinuses/Orbits: No acute finding. Other: None IMPRESSION: 1. No acute intracranial pathology. 2. Age-related atrophy and chronic microvascular ischemic changes. Multiple old infarcts. Electronically Signed   By: Elgie Collard M.D.   On: 11/11/2019 18:49   CT Head Wo Contrast  Result Date: 11/05/2019 CLINICAL DATA:  86 year old female with fall 2 weeks ago. Progressive altered mental status. Left side facial droop and weakness noticed yesterday. EXAM: CT HEAD WITHOUT CONTRAST TECHNIQUE: Contiguous axial images were obtained from the base of the skull through the vertex without intravenous contrast. COMPARISON:  [facility] head CT 10/20/2019. FINDINGS: Brain: Stable cerebral volume with chronic encephalomalacia in both the posterior right MCA and the left PCA territories. Patchy chronic left cerebellar infarcts also noted and stable. Stable gray-white matter differentiation throughout the brain. No midline  shift, ventriculomegaly, mass effect, evidence of mass lesion, intracranial hemorrhage or evidence of cortically based acute infarction. Vascular: Calcified atherosclerosis at the skull base. No suspicious intracranial vascular hyperdensity. Skull: Stable and intact. Sinuses/Orbits: Visualized paranasal sinuses and mastoids are stable and well pneumatized. Other: Resolved left anterior scalp hematoma since last month. Stable and negative orbits. IMPRESSION: 1. No acute intracranial abnormality. 2. Chronic infarcts in the posterior right MCA, left PCA, and left cerebellar artery territories  appear stable. 3. Resolved left scalp hematoma since last month. Electronically Signed   By: Odessa Fleming M.D.   On: 11/05/2019 13:57   CT Cervical Spine Wo Contrast  Result Date: 11/11/2019 CLINICAL DATA:  Altered mental status EXAM: CT CERVICAL SPINE WITHOUT CONTRAST TECHNIQUE: Multidetector CT imaging of the cervical spine was performed without intravenous contrast. Multiplanar CT image reconstructions were also generated. COMPARISON:  10/20/2019 FINDINGS: Alignment: Facet joints are aligned without dislocation. Dens and lateral masses are aligned. Unchanged grade 1 anterolisthesis C6 on C7. Skull base and vertebrae: No acute cervical vertebral body fracture. No primary bone lesion. Subacute to chronic appearing comminuted fracture of the right clavicular head (series 4, images 74-79; series 8, image 13). Appearance unchanged from 10/20/2019. Soft tissues and spinal canal: No prevertebral fluid or swelling. No visible canal hematoma. Disc levels: Multilevel intervertebral disc height loss with bilateral facet and uncovertebral arthropathy, without interval progression from 10/20/2019. Upper chest: Visualized lung apices clear. Other: Atherosclerotic calcifications of the visualized vasculature. IMPRESSION: 1. No acute fracture or traumatic listhesis of the cervical spine. 2. Multilevel degenerative changes of the cervical spine,  without interval progression from 10/20/2019. 3. Subacute to chronic appearing comminuted fracture of the right clavicular head. Electronically Signed   By: Duanne Guess D.O.   On: 11/11/2019 18:55   DG Chest Portable 1 View  Result Date: 11/11/2019 CLINICAL DATA:  86 year old female with altered mental status. EXAM: PORTABLE CHEST 1 VIEW COMPARISON:  Chest radiograph dated 11/05/2019. FINDINGS: Minimal left lung base atelectasis. No consolidative changes. No pleural effusion pneumothorax. Mild interstitial coarsening similar to prior radiograph which may be chronic. Atypical infection is not excluded. Clinical correlation is recommended. Stable cardiomediastinal silhouette. Atherosclerotic calcification of the aorta. No acute osseous pathology. Left axillary vascular stent. IMPRESSION: No interval change. Electronically Signed   By: Elgie Collard M.D.   On: 11/11/2019 18:55   DG Chest Port 1 View  Result Date: 11/05/2019 CLINICAL DATA:  Altered mental status.  Recent fall EXAM: PORTABLE CHEST 1 VIEW COMPARISON:  April 05, 2018 FINDINGS: There is apparent scarring in the right upper lobe. Lungs elsewhere clear. Heart size and pulmonary vascularity are normal. No adenopathy. There is aortic atherosclerosis. No adenopathy. No bone lesions. Stent noted in left brachial region. Previously noted hiatal hernia not well seen currently. IMPRESSION: Scarring right upper lobe. Lungs otherwise clear. Heart size normal. No adenopathy. Previously noted hiatal hernia not well seen on this examination. Aortic Atherosclerosis (ICD10-I70.0). Electronically Signed   By: Bretta Bang III M.D.   On: 11/05/2019 11:28    MDM    Clinical Course as of Nov 11 2138  Sun Nov 11, 2019  1149 Spoke with Judeth Cornfield, Care Manager, for update on plan for patient. States they are awaiting appeal with insurance for SNF placement.  Secondary plan is for home health. Face to face order has already been placed.   [SJ]   1230 Noted to be 9.8 on 10/20/19, located in Care Everywhere.  Hemoglobin(!): 9.8 [SJ]  1230 Noted to be 1.56 on 10/20/19, located in Care Everywhere.  Creatinine(!): 1.57 [SJ]  1230 Noted to be 24 on 10/20/19, located in Care Everywhere.  BUN(!): 24 [SJ]  1649 Appeal for SNF has been denied. Tomorrow, Transitions of Care team will coordinate with patient's daughter for patient's discharge and set up for home health.   [SJ]  1752 RN states patient was more interactive around lunch time. She is much more subdued now.  Patient's lunch tray is  still at the bedside with what appears to be only a few bites out of a pudding cup.  There is a small bottle of water that is about half gone. Her urine is dark and orange-tinged.   [SJ]  1914 Tried to call patient's daughter, Ivin Booty [phone]).  No answer.  Left voicemail with callback number.   [SJ]  28 Spoke with patient's daughter, Zerita Boers. She states patient sent her to the ED last Monday because she observed left-sided facial droop and slurred speech.   Baseline mental status is awake and alert, but with confused and slow responses.  This has been the case since she had a major stroke in 2016.  However, her mental status and responsiveness has been worse since her fall at the beginning of June. We discussed the finding of the clavicular fracture on the cervical spine CT today.  She states she was not aware of this injury.  We discussed anticoagulation and stroke prevention.  She states patient was removed from aspirin and any anticoagulants October 2020 due to anemia and suspicion for possible GI bleed. Her hemoglobin had fallen to the 8 range, but has since improved and stayed stable in the 9 range.   [SJ]  1934 Patient reevaluated. Now she is spontaneously alert. She will follow commands to smile, open her mouth, move her eyes, squeeze fingers.  Her grips appear to be equal bilaterally.  I now do not note any facial droop. She does seem to  be weak overall and is only able to perform small movements in her upper or lower extremities, however, they seem to have equal strength bilaterally.   [SJ]  2054 Spoke with daughter again.  We discussed goals of care for the patient as well as options for how to proceed. I told her that it would likely not make much difference if an MRI is performed and an acute stroke is found, especially because of her age and also because of her underlying cognitive and functional deficits.  I explicitly discussed the fact that the plan of care would likely stay the same regardless of the MRI results.  She considered this information for a while and then decided it would give her peace of mind to know one way or the other.  She states she would appreciate a call back with the results.  She states if results return after 11 PM tonight, she would rather have a phone call in the morning.    [SJ]  2107 Spoke with Dr. Erlinda Hong, orthopedic surgery. Can place the patient in a sling for comfort.  She can use the extremity, as tolerated.   She can follow-up in the office, but if she chooses not to that is okay too.   [SJ]    Clinical Course User Index [SJ] Kinan Safley, Helane Gunther, PA-C    Original plan was for SNF placement.  This has been rejected by the patient's insurance.  Suggestion for home health instead. Transition of care team to coordinate patient's discharge with the patient's daughter tomorrow.  Upon my evaluation of the patient, there was some concern for change in her responsiveness and neurologic status. When I evaluated each of the previous provider notes, I did not see them describe a presentation in the patient like I was seeing today.  For this reason, I reinitiated a work-up on the patient. I personally reviewed and interpreted her labs and imaging studies. Her lab work has stayed stable. There was a right medial clavicular fracture noted on  C-spine CT, subacute to chronic, which means it may have occurred  during the patient's fall at the beginning of June since it was not noted on imaging studies of the region in May. Her repeat imaging studies were otherwise unremarkable for acute abnormalities.  Findings and plan of care discussed with Kristine Royal, MD.    At the end of my shift, report was given to Will Uvaldo Rising, PA-C. Plan: Review MRI results.  If acutely abnormal, contact neurology for them to consult on the patient.  If acute abnormalities exist, there will likely need to be reevaluation of the patient's case for SNF placement by transition of care team.  If no acute abnormalities, plan to continue with original plan for discharge home once home health has been coordinated through case management.  Vitals:   11/10/19 1411 11/10/19 2015 11/11/19 0634 11/11/19 1031  BP: (!) 151/70 (!) 143/46 (!) 138/37 125/60  Pulse: 97 76 68 65  Resp:  15 17 16   Temp:  97.7 F (36.5 C) 97.8 F (36.6 C)   TempSrc:  Oral Oral   SpO2: 98% 98% 97% 95%  Weight:      Height:          11/11/19 2146    2147, MD 11/15/19 [phone]

## 2019-11-11 NOTE — ED Provider Notes (Addendum)
Patient boarding in the ED while placement is attempting to be made.  Daughter was concerned about episode of slurred speech that was not noted upon patient's arrival to the ED.  MRI pending to ensure no stroke or other intracranial abnormality.  MRI negative for acute findings.  Per chart review, patient will either be accepted at a location today and go there, or she will not and she will be discharged with [facility] services.  I called daughter, Erin Fisher, and discussed normal results of MRI and confirmed plan.  Daughter had no questions at this time.    MR BRAIN WO CONTRAST  Result Date: 11/12/2019 CLINICAL DATA:  Initial evaluation for focal neural deficit, stroke suspected. EXAM: MRI HEAD WITHOUT CONTRAST TECHNIQUE: Multiplanar, multiecho pulse sequences of the brain and surrounding structures were obtained without intravenous contrast. COMPARISON:  Prior head CT from earlier the same day. FINDINGS: Brain: Generalized age-related cerebral atrophy. Encephalomalacia with gliosis involving the right parieto-occipital region compatible with a remote ischemic infarct. Mild chronic hemosiderin staining noted within this region. Additional remote left occipital and left cerebellar infarcts noted as well. Underlying mild chronic microvascular ischemic disease. No acute intracranial infarct. Gray-white matter differentiation otherwise maintained. No evidence for acute intracranial hemorrhage. No mass lesion, midline shift or mass effect. Mild ventricular prominence related to global parenchymal volume loss of hydrocephalus. No extra-axial fluid collection. Pituitary gland suprasellar region normal. Midline structures intact. Vascular: Major intracranial vascular flow voids are maintained. Skull and upper cervical spine: Craniocervical junction within normal limits. Bone marrow signal intensity normal. No scalp soft tissue abnormality. Sinuses/Orbits: Patient status post bilateral ocular lens replacement root globes and  orbital soft tissues demonstrate no acute finding. Paranasal sinuses are clear. No significant mastoid effusion. Inner ear structures grossly normal. Other: None. IMPRESSION: 1. No acute intracranial abnormality. 2. 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 3. Underlying age-related cerebral atrophy with mild chronic microvascular ischemic disease. Electronically Signed   By: Rise Mu M.D.   On: 11/12/2019 02:26        Alveria Apley, PA-C 11/12/19 8657    Dione Booze, MD 11/12/19 [phone]

## 2019-11-11 NOTE — Progress Notes (Signed)
CSW notes authorization has been closed on [employer]'s end. CSW reached out to [facility] Medicare appeal line and left v/m for return call. Current disposition is daughter is waiting to hear from [facility] Monday for placement, otherwise family is aware she will have to discharge home with home health and possibly custodial care. CSW will continue to follow-up with [employer]'s appeal process with limited expectations.

## 2019-11-11 NOTE — ED Notes (Signed)
PA notified of CBG of 90.

## 2019-11-11 NOTE — ED Notes (Signed)
To bedside to help feed.

## 2019-11-12 ENCOUNTER — Emergency Department (HOSPITAL_COMMUNITY): Payer: Medicare HMO

## 2019-11-12 NOTE — Progress Notes (Addendum)
9am: CSW spoke with Brooke at [facility] in [place] who states that due to finanical reasons, the patient will not be going to the facility.  CSW spoke with patient's daughter Jasmine December to request her permission to search for [facility] services - PT, social worker, and aide.   Durward Mallard, RN CM will begin search for [facility] services.  8am: CSW spoke with patient's daughter Jasmine December to confirm the discharge plan details - Jasmine December states she is waiting to hear from [facility] this morning regarding an admissions decision. Jasmine December agreeable to take her mother home with [facility] services in place if [facility] will not accept her. Jasmine December to return call to CSW as soon as additional information is available.  Edwin Dada, MSW, LCSW-A Transitions of Care  Clinical Social Worker  [facility]  Medical ICU [phone]

## 2019-11-12 NOTE — ED Notes (Signed)
Lunch Tray Ordered @ 1050. 

## 2019-11-12 NOTE — ED Notes (Signed)
Breakfast tray ordered 

## 2019-11-12 NOTE — ED Notes (Signed)
Meal tray delivered.

## 2019-11-12 NOTE — ED Notes (Signed)
Called [facility] for Transport  °

## 2019-11-12 NOTE — Discharge Planning (Signed)
Erin Fisher [name], RN, BSN, [place] [phone] RNCM spoke with pt at bedside regarding discharge planning for Home Health Services. Offered pt medicare.gov list of home health agencies to choose from.  Pt chose [facility] to render services. [place] [facility] notified. Patient made aware that [facility] will be in contact in 24-48 hours.  No DME needs identified at this time.

## 2019-11-12 NOTE — ED Provider Notes (Signed)
Physical Exam  BP (!) 123/91   Pulse 70   Temp 98.5 F (36.9 C) (Oral)   Resp (!) 25   Ht 5\' 3"  (1.6 m)   Wt 66.3 kg   SpO2 95%   BMI 25.89 kg/m   Physical Exam Vitals and nursing note reviewed.  Constitutional:      General: She is not in acute distress.    Appearance: She is well-developed. She is not diaphoretic.  HENT:     Head: Normocephalic and atraumatic.  Eyes:     General: No scleral icterus.    Conjunctiva/sclera: Conjunctivae normal.  Pulmonary:     Effort: Pulmonary effort is normal. No respiratory distress.  Musculoskeletal:     Cervical back: Normal range of motion.  Skin:    Findings: No rash.  Neurological:     Mental Status: She is alert.     ED Course/Procedures   Clinical Course as of Nov 11 1136  Sun Nov 11, 2019  1149 Spoke with 1150, Care Manager, for update on plan for patient. States they are awaiting appeal with insurance for SNF placement.  Secondary plan is for home health. Face to face order has already been placed.   [SJ]  1230 Noted to be 9.8 on 10/20/19, located in Care Everywhere.  Hemoglobin(!): 9.8 [SJ]  1230 Noted to be 1.56 on 10/20/19, located in Care Everywhere.  Creatinine(!): 1.57 [SJ]  1230 Noted to be 24 on 10/20/19, located in Care Everywhere.  BUN(!): 24 [SJ]  1649 Appeal for SNF has been denied. Tomorrow, Transitions of Care team will coordinate with patient's daughter for patient's discharge and set up for home health.   [SJ]  1752 RN states patient was more interactive around lunch time. She is much more subdued now.  Patient's lunch tray is still at the bedside with what appears to be only a few bites out of a pudding cup.  There is a small bottle of water that is about half gone. Her urine is dark and orange-tinged.   [SJ]  1914 Tried to call patient's daughter, 10/22/19 [phone]).  No answer.  Left voicemail with callback number.   [SJ]  39 Spoke with patient's daughter, 0. She states patient  sent her to the ED last Monday because she observed left-sided facial droop and slurred speech.   Baseline mental status is awake and alert, but with confused and slow responses.  This has been the case since she had a major stroke in 2016.  However, her mental status and responsiveness has been worse since her fall at the beginning of June. We discussed the finding of the clavicular fracture on the cervical spine CT today.  She states she was not aware of this injury.  We discussed anticoagulation and stroke prevention.  She states patient was removed from aspirin and any anticoagulants October 2020 due to anemia and suspicion for possible GI bleed. Her hemoglobin had fallen to the 8 range, but has since improved and stayed stable in the 9 range.   [SJ]  1934 Patient reevaluated. Now she is spontaneously alert. She will follow commands to smile, open her mouth, move her eyes, squeeze fingers.  Her grips appear to be equal bilaterally.  I now do not note any facial droop. She does seem to be weak overall and is only able to perform small movements in her upper or lower extremities, however, they seem to have equal strength bilaterally.   [SJ]  2054 Spoke with daughter again.  We discussed goals of care for the patient as well as options for how to proceed. I told her that it would likely not make much difference if an MRI is performed and an acute stroke is found, especially because of her age and also because of her underlying cognitive and functional deficits.  I explicitly discussed the fact that the plan of care would likely stay the same regardless of the MRI results.  She considered this information for a while and then decided it would give her peace of mind to know one way or the other.  She states she would appreciate a call back with the results.  She states if results return after 11 PM tonight, she would rather have a phone call in the morning.    [SJ]  2107 Spoke with Dr. Erlinda Hong, orthopedic  surgery. Can place the patient in a sling for comfort.  She can use the extremity, as tolerated.   She can follow-up in the office, but if she chooses not to that is okay too.   [SJ]    Clinical Course User Index [SJ] Joy, Shawn C, PA-C    Procedures  MDM  Patient awaiting placement versus being discharged back to home with home health.  She had an episode of slurred speech last night which daughter was concerned about.  MRI was obtained which showed no acute findings.  She is placed in a sling.  She has no concerns today except requesting a cup of coffee.    Portions of this note were generated with Lobbyist. Dictation errors may occur despite best attempts at proofreading.       Delia Heady, PA-C 11/12/19 1139    Tegeler, Gwenyth Allegra, MD 11/12/19 [phone]

## 2019-11-12 NOTE — ED Notes (Signed)
Pt fed grits and applesauce. Pt said she did not want anymore after having about 5 bites of each.

## 2019-11-12 NOTE — ED Notes (Signed)
Pt back from MRI 

## 2019-11-13 LAB — URINE CULTURE: Culture: >=100000 — AB

## 2019-11-14 ENCOUNTER — Telehealth: Payer: Self-pay

## 2019-11-14 NOTE — Progress Notes (Signed)
ED Antimicrobial Stewardship Positive Culture Follow Up   Erin Fisher is an 86 y.o. female who presented to [facility] on 11/05/2019 with a chief complaint of  Chief Complaint  Patient presents with  . Altered Mental Status    Recent Results (from the past 720 hour(s))  Urine culture     Status: Abnormal   Collection Time: 11/11/19  5:48 PM   Specimen: Urine, Random  Result Value Ref Range Status   Specimen Description URINE, RANDOM  Final   Special Requests   Final    NONE Performed at [facility], [address] [place], [place] [address]    Culture >=100,000 COLONIES/mL KLEBSIELLA OXYTOCA (A)  Final   Report Status 11/13/2019 FINAL  Final   Organism ID, Bacteria KLEBSIELLA OXYTOCA (A)  Final      Susceptibility   Klebsiella oxytoca - MIC*    AMPICILLIN >=32 RESISTANT Resistant     CEFAZOLIN 8 SENSITIVE Sensitive     CEFTRIAXONE <=0.25 SENSITIVE Sensitive     CIPROFLOXACIN <=0.25 SENSITIVE Sensitive     GENTAMICIN <=1 SENSITIVE Sensitive     IMIPENEM <=0.25 SENSITIVE Sensitive     NITROFURANTOIN 64 INTERMEDIATE Intermediate     TRIMETH/SULFA <=20 SENSITIVE Sensitive     AMPICILLIN/SULBACTAM 8 SENSITIVE Sensitive     PIP/TAZO <=4 SENSITIVE Sensitive     * >=100,000 COLONIES/mL KLEBSIELLA OXYTOCA    []  Treated with N/A, organism resistant to prescribed antimicrobial [x]  Patient discharged originally without antimicrobial agent and treatment is now indicated  New antibiotic prescription: cefdinir 300mg  PO daily x 5 days  ED Provider: , PA-C   Pearly Ap[place]ito, 11/14/2019, 8:01 AM Clinical Pharmacist Monday - Friday phone -  [phone] Saturday - Sunday phone - [phone]

## 2019-11-14 NOTE — Telephone Encounter (Signed)
Post ED Visit - Positive Culture Follow-up: Successful Patient Follow-Up  Culture assessed and recommendations reviewed by:  []  , Pharm.D. []  Enzo Bi, Pharm.D., BCPS AQ-ID []  , Pharm.D., BCPS []  Celedonio Miyamoto, Pharm.D., BCPS []  [place], Garvin Fila.D., BCPS, AAHIVP []  , Pharm.D., BCPS, AAHIVP [x]  Georgina Pillion, PharmD, BCPS []  , PharmD, BCPS []  [place], PharmD, BCPS []  [address], PharmD  Positive urine culture  [x]  Patient discharged without antimicrobial prescription and treatment is now indicated []  Organism is resistant to prescribed ED discharge antimicrobial []  Patient with positive blood cultures  Changes discussed with ED provider: PA-C New antibiotic prescription Cefdinir 300 mg PC daily x 5 days Called to [facility]   Contacted patient, date 11/14/19, time 0943   11/14/2019, 9:41 AM

## 2019-11-14 NOTE — Telephone Encounter (Deleted)
Post ED Visit - Positive Culture Follow-up: Unsuccessful Patient Follow-up  Culture assessed and recommendations reviewed by:  []  , Pharm.D. []  Enzo Bi, Pharm.D., BCPS AQ-ID []  , Pharm.D., BCPS []  Celedonio Miyamoto, .D., BCPS []  [place], .D., BCPS, AAHIVP []  Georgina Pillion, Pharm.D., BCPS, AAHIVP []  [address], PharmD []  , PharmD, BCPS Rachel Rumbarger Pharm D Positive urine culture Needs Cefdinir 300 mg PO daily  X 5 days per [place] PA [x]  Patient discharged without antimicrobial prescription and treatment is now indicated []  Organism is resistant to prescribed ED discharge antimicrobial []  Patient with positive blood cultures   Unable to contact patient after 3 attempts, letter will be sent to address on file  [address] 11/14/2019, 9:19 AM

## 2021-01-22 DEATH — deceased

## 2021-10-27 IMAGING — MR MR HEAD W/O CM
12 of 13 series · 44 of 48 positions shown · non-contrast
Comparison: Prior head CT from earlier the same day.

CLINICAL DATA: Initial evaluation for focal neural deficit, stroke
suspected.

EXAM:
MRI HEAD WITHOUT CONTRAST
TECHNIQUE: Multiplanar, multiecho pulse sequences of the brain and surrounding
structures were obtained without intravenous contrast.

[Series 5: DWI · axial · 3.0mm · 0.88mm/px · z∈[-67,+75]mm · 7 of 100 slices shown (1 of 4)]
[im 1/100]
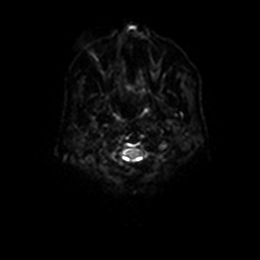
[im 17/100]
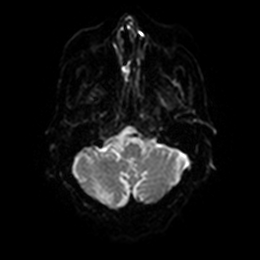
[im 34/100]
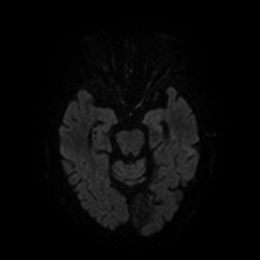
[im 50/100]
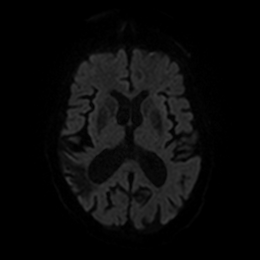
[im 67/100]
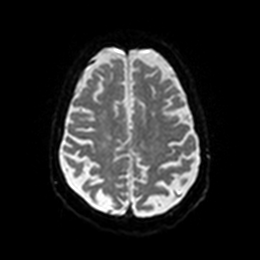
[im 83/100]
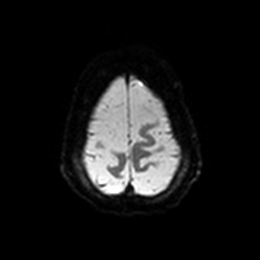
[im 100/100]
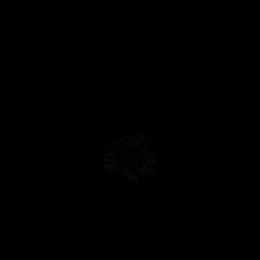

[Series 6: DWI · axial · 3.0mm · 0.88mm/px · z∈[-67,+75]mm · 4 of 50 slices shown (2 of 4)]
[im 1/50]
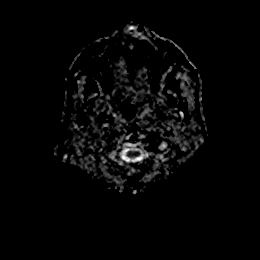
[im 17/50]
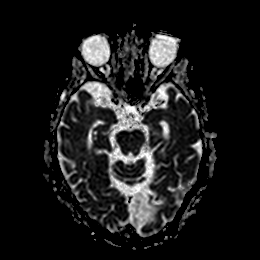
[im 33/50]
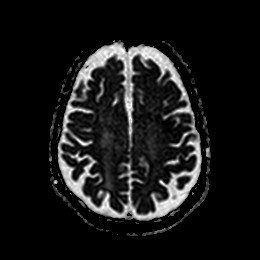
[im 50/50]
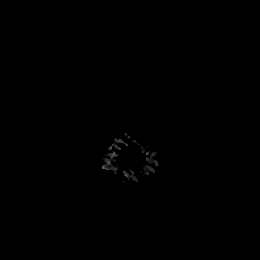

[Series 7: DWI · coronal · 4.0mm · 0.88mm/px · 6 of 72 slices shown (3 of 4)]
[im 1/72]
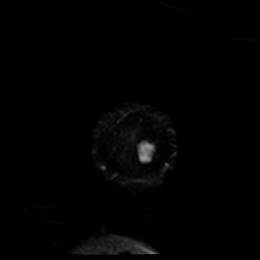
[im 15/72]
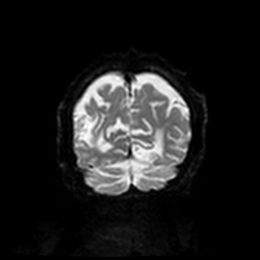
[im 29/72]
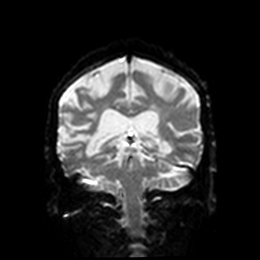
[im 43/72]
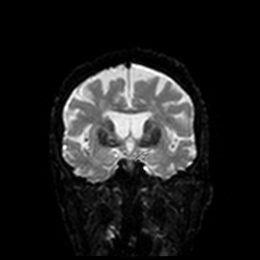
[im 57/72]
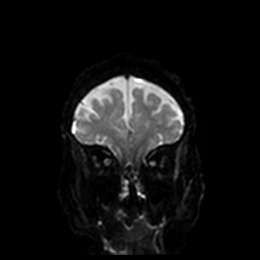
[im 72/72]
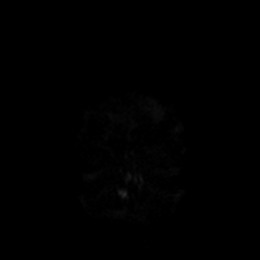

[Series 8: DWI · coronal · 4.0mm · 0.88mm/px · 3 of 36 slices shown (4 of 4)]
[im 1/36]
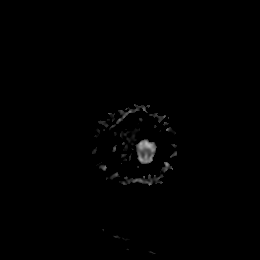
[im 18/36]
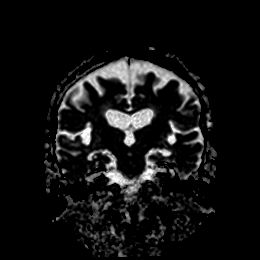
[im 36/36]
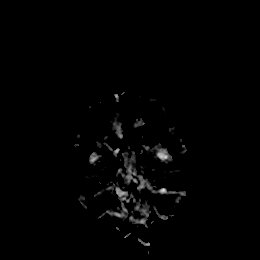

[Series 9: T1 · sagittal · 5.0mm · 0.75mm/px · 2 of 23 slices shown]
[im 1/23]
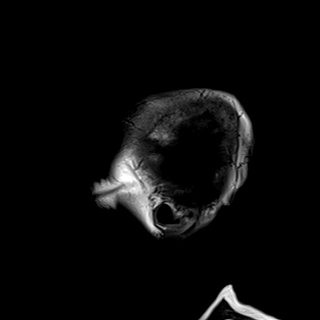
[im 23/23]
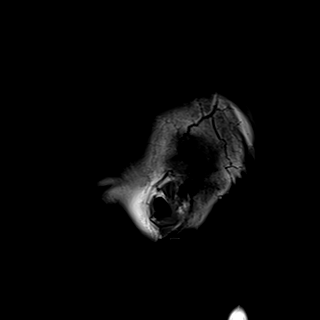

[Series 10: T2 · axial · 5.0mm · 0.90mm/px · z∈[-64,+75]mm · 2 of 25 slices shown (1 of 2)]
[im 1/25]
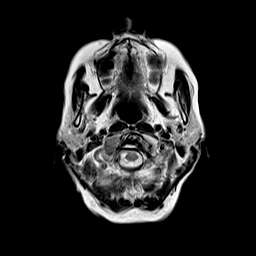
[im 25/25]
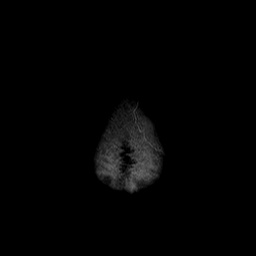

[Series 11: FLAIR · axial · 5.0mm · 0.45mm/px · z∈[-61,+78]mm · 2 of 25 slices shown]
[im 1/25]
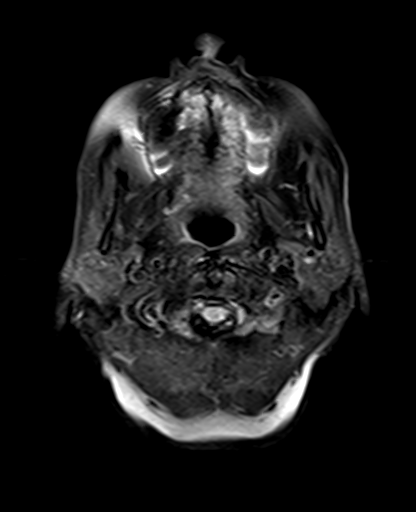
[im 25/25]
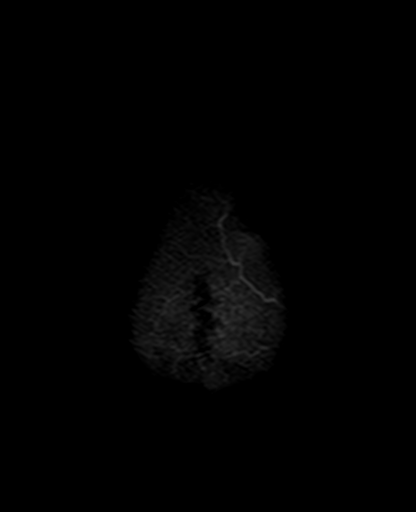

[Series 12: mag_images · axial · 3.0mm · 0.90mm/px · z∈[-60,+76]mm · 4 of 48 slices shown]
[im 1/48]
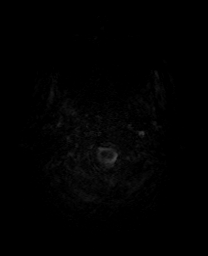
[im 16/48]
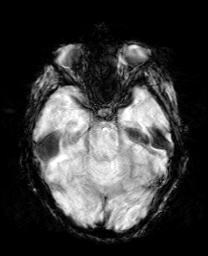
[im 32/48]
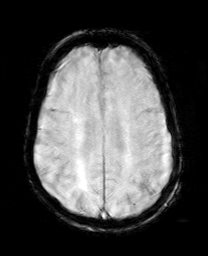
[im 48/48]
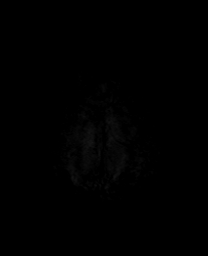

[Series 13: pha_images · axial · 3.0mm · 0.90mm/px · z∈[-60,+76]mm · 4 of 48 slices shown]
[im 1/48]
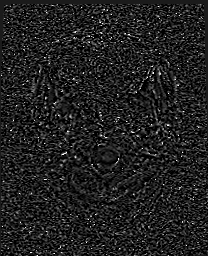
[im 16/48]
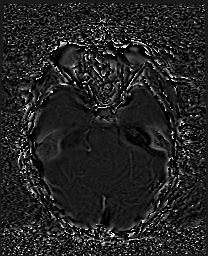
[im 32/48]
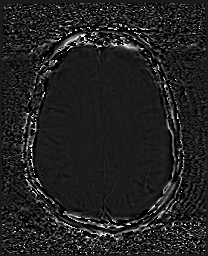
[im 48/48]
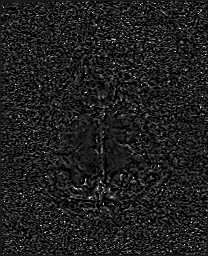

[Series 14: swi_images · axial · 3.0mm · 0.90mm/px · z∈[-60,+76]mm · 4 of 48 slices shown]
[im 1/48]
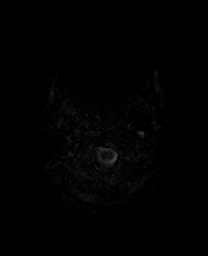
[im 16/48]
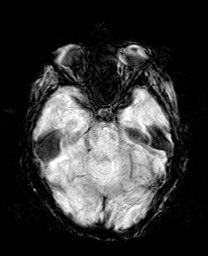
[im 32/48]
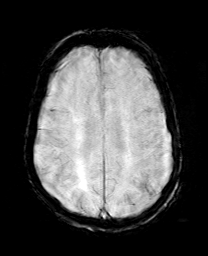
[im 48/48]
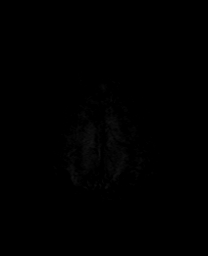

[Series 15: mip_images(sw) · axial · 24.0mm · 0.90mm/px · z∈[-50,+66]mm · 3 of 41 slices shown]
[im 1/41]
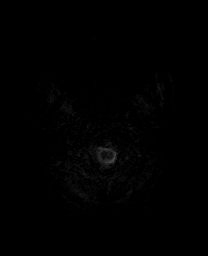
[im 21/41]
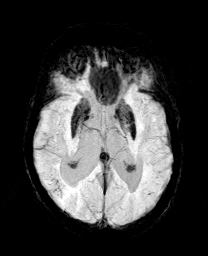
[im 41/41]
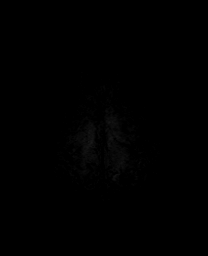

[Series 17: T2 · coronal · 5.0mm · 0.43mm/px · 3 of 31 slices shown (2 of 2)]
[im 1/31]
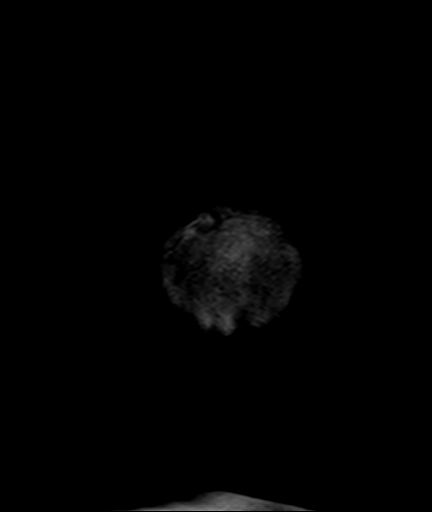
[im 16/31]
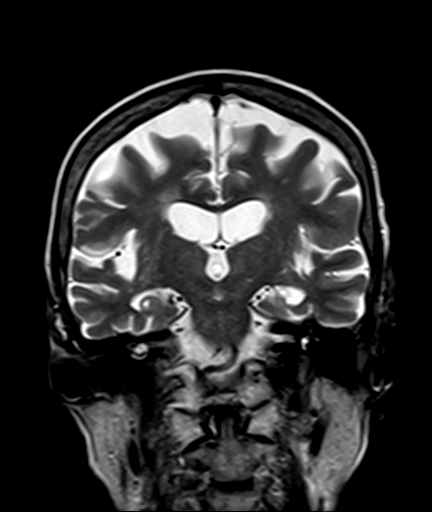
[im 31/31]
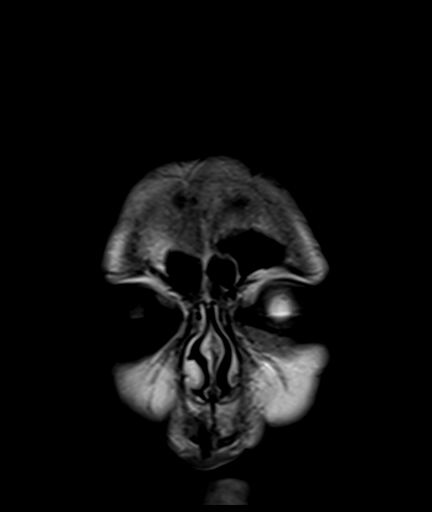

[44 of 48 positions shown; findings below may reference images not displayed]

FINDINGS: Brain: Generalized age-related cerebral atrophy. Encephalomalacia
with gliosis involving the right parieto-occipital region compatible
with a remote ischemic infarct. Mild chronic hemosiderin staining
noted within this region. Additional remote left occipital and left
cerebellar infarcts noted as well. Underlying mild chronic
microvascular ischemic disease.

No acute intracranial infarct. Gray-white matter differentiation
otherwise maintained. No evidence for acute intracranial hemorrhage.

No mass lesion, midline shift or mass effect. Mild ventricular
prominence related to global parenchymal volume loss of
hydrocephalus. No extra-axial fluid collection. Pituitary gland
suprasellar region normal. Midline structures intact.

Vascular: Major intracranial vascular flow voids are maintained.

Skull and upper cervical spine: Craniocervical junction within
normal limits. Bone marrow signal intensity normal. No scalp soft
tissue abnormality.

Sinuses/Orbits: Patient status post bilateral ocular lens
replacement root globes and orbital soft tissues demonstrate no
acute finding. Paranasal sinuses are clear. No significant mastoid
effusion. Inner ear structures grossly normal.

Other: None.
IMPRESSION: 1. No acute intracranial abnormality.
2. Chronic right parieto-occipital infarct, with additional remote
left occipital and left cerebellar infarcts.
3. Underlying age-related cerebral atrophy with mild chronic
microvascular ischemic disease.
